# Patient Record
Sex: Female | Born: 1985 | Race: Black or African American | Hispanic: No
Health system: Western US, Academic
[De-identification: ages and names within clinical notes are randomized; demographics above are authoritative.]

## PROBLEM LIST (undated history)

## (undated) DIAGNOSIS — G43909 Migraine, unspecified, not intractable, without status migrainosus: Secondary | ICD-10-CM

## (undated) DIAGNOSIS — F32A Depression, unspecified: Secondary | ICD-10-CM

## (undated) DIAGNOSIS — F329 Major depressive disorder, single episode, unspecified: Secondary | ICD-10-CM

## (undated) DIAGNOSIS — E559 Vitamin D deficiency, unspecified: Secondary | ICD-10-CM

## (undated) HISTORY — PX: OTHER SURGICAL HISTORY: SHX169

## (undated) HISTORY — DX: Depression, unspecified: F32.A

## (undated) HISTORY — DX: Vitamin D deficiency, unspecified: E55.9

## (undated) HISTORY — DX: Major depressive disorder, single episode, unspecified: F32.9

## (undated) HISTORY — DX: Migraine, unspecified, not intractable, without status migrainosus: G43.909

---

## 2012-04-02 LAB — HM PAP SMEAR: HM Pap smear: NORMAL

## 2013-01-12 ENCOUNTER — Encounter: Payer: 59 | Attending: Obstetrics and Gynecology | Admitting: Dietician

## 2013-01-12 ENCOUNTER — Encounter: Payer: Self-pay | Admitting: Dietician

## 2013-01-12 VITALS — Ht 67.0 in | Wt 206.0 lb

## 2013-01-12 DIAGNOSIS — E663 Overweight: Secondary | ICD-10-CM | POA: Insufficient documentation

## 2013-01-12 DIAGNOSIS — Z713 Dietary counseling and surveillance: Secondary | ICD-10-CM | POA: Insufficient documentation

## 2013-01-12 NOTE — Progress Notes (Signed)
Medical Nutrition Therapy:  Appt start time: 0800 end time:  0900.  Assessment:  Primary concerns today: Overweight. Pt notes wt gain of about 20 pounds during last year or so of college, which she recently graduated. She admits during this time she did not exercise much at all and ate more sweets, drank more sweet drinks. She claims to have never been less than 170 pounds as an adult, and at the time she was very lean.  MEDICATIONS: see list.   DIETARY INTAKE: Usual eating pattern includes 2-3 meals and 2 snacks per day. Everyday foods include yogurt, oatmeal.  Avoided foods include none noted.    24-hr recall:  B ( AM): oatmeal (cooked in water) with 1 TBSP brown sugar and cinnamon. 28 oz. Green tea with 1 TBSP honey. Rarely eggs (never on working days).  Snk ( AM): 6 oz. Plain greek yogurt with 1 TBSP honey, vanilla extract, 1 cup frozen strawberries.   L ( PM): pork chop with sauteed zucchini, broccoli, brown rice, grapes.  Snk ( PM): apple or almonds, chocolate candies D ( PM): often skips dinner on days she works. On off days, she often gets [employer] burrito salad with brown rice, black beans, salsa, sour cream, cheese.  Snk ( PM): rarely, maybe a piece of fruit. May grab a milkshake or ice cream cone on way home from work.  Beverages: drinks water throughout day mostly, limiting sweet drinks to about 1-3 per week. Occasional chocolate milk. No EtOH.   Pt states she has a strong sweet tooth, and will go out of her way to get sweeter foods.   Usual physical activity: in past few weeks has begun exercising more. MWF weight training DVD from home (40-55 min). TuTh cardio DVDs (30-45 min).   Progress Towards Goal(s):  In progress.   Nutritional Diagnosis:  [place]-3.3 Overweight/obesity As related to Hx of sedentary behavior, regular consumption of swetened beverages, sweet dessert foods.  As evidenced by pt report, BMI>25.    Intervention:  Nutrition counseling provided regarding higher  kcal items pt regularly consumes, controlling portion sizes, limiting meal skipping, and ensuring adequate protein consumption. RD encouraged pt to gain consistency with exercise habits and dietary control. Pt has been tracking food intake on MyFitnessPal already, so RD set a kcal and protein goal for the pt at 1500 kcal average (stay within 1200-1800 each day) and 100 g Protein per day. RD highlighted the need to eat at least 3 meals per day, each with a protein component, and ideally with a high fiber food to improve satiety.  Handouts given during visit include:  Best Pro, CHO, and fats  High Fat, Sugar, Pro, and Fiber foods  Monitoring/Evaluation:  Dietary intake, exercise, portion control, and body weight in 2 month(s).

## 2013-03-09 ENCOUNTER — Ambulatory Visit: Payer: 59 | Admitting: Dietician

## 2013-04-30 ENCOUNTER — Encounter: Payer: Self-pay | Admitting: Family Medicine

## 2013-04-30 ENCOUNTER — Ambulatory Visit (INDEPENDENT_AMBULATORY_CARE_PROVIDER_SITE_OTHER): Payer: 59 | Admitting: Family Medicine

## 2013-04-30 VITALS — BP 122/74 | HR 59 | Temp 98.6°F | Resp 16 | Wt 209.0 lb

## 2013-04-30 DIAGNOSIS — G43109 Migraine with aura, not intractable, without status migrainosus: Secondary | ICD-10-CM | POA: Insufficient documentation

## 2013-04-30 DIAGNOSIS — Z202 Contact with and (suspected) exposure to infections with a predominantly sexual mode of transmission: Secondary | ICD-10-CM

## 2013-04-30 DIAGNOSIS — E669 Obesity, unspecified: Secondary | ICD-10-CM

## 2013-04-30 MED ORDER — RIZATRIPTAN BENZOATE 10 MG PO TABS: 10.0000 mg | ORAL_TABLET | ORAL | Status: DC | PRN

## 2013-04-30 MED ORDER — ONDANSETRON HCL 4 MG PO TABS: 4.0000 mg | ORAL_TABLET | Freq: Three times a day (TID) | ORAL | Status: DC | PRN

## 2013-04-30 NOTE — Progress Notes (Signed)
   Subjective:    Patient ID: Shannon Anderson, female    DOB: 15-Feb-1986, 28 y.o.   MRN: [id number]  HPI New to establish.  Previous MD- none.  GYN- [facility] ([facility])  Migraines- ongoing problem for pt, always on R side behind R eye.  Unaware of trigger.  + nausea, aura.  Sensitive to smells, light, and sound.  Taking Imitrex as needed- was not effective.  Typically taking Excedrin Migraine and trying to sleep it off.  Not tied to menstrual cycle.  Occuring 'a few times/yr'.  Obesity- ongoing problem.  Working out regularly.  Not following a particular diet  Possible exposure to STD- would like testing done  Review of Systems For ROS see HPI     Objective:   Physical Exam  Vitals reviewed. Constitutional: She is oriented to person, place, and time. She appears well-developed and well-nourished. No distress.  HENT:  Head: Normocephalic and atraumatic.  Eyes: Conjunctivae and EOM are normal. Pupils are equal, round, and reactive to light.  Neck: Normal range of motion. Neck supple. No thyromegaly present.  Cardiovascular: Normal rate, regular rhythm, normal heart sounds and intact distal pulses.   No murmur heard. Pulmonary/Chest: Effort normal and breath sounds normal. No respiratory distress.  Abdominal: Soft. She exhibits no distension. There is no tenderness.  Musculoskeletal: She exhibits no edema.  Lymphadenopathy:    She has no cervical adenopathy.  Neurological: She is alert and oriented to person, place, and time.  Skin: Skin is warm and dry.  Psychiatric: She has a normal mood and affect. Her behavior is normal.          Assessment & Plan:

## 2013-04-30 NOTE — Patient Instructions (Signed)
Follow up in 1 year or as needed We'll notify you of your lab results and make any changes if needed At the first sign of headache, take 2 Excedrin Migraine.  If headache continues, take Maxalt- repeat in 2 hrs if headache persists Zofran as needed for nausea Call with any questions or concerns Welcome!  We're glad to have you!!!

## 2013-04-30 NOTE — Progress Notes (Signed)
Pre-visit discussion using our clinic review tool, as applicable. No additional management support is needed unless otherwise documented below in the visit note.  

## 2013-05-01 LAB — CBC WITH DIFFERENTIAL/PLATELET
Basophils Absolute: 0 10*3/uL (ref 0.0–0.1)
Basophils Relative: 0.3 % (ref 0.0–3.0)
EOS ABS: 0.1 10*3/uL (ref 0.0–0.7)
Eosinophils Relative: 0.9 % (ref 0.0–5.0)
HCT: 37.3 % (ref 36.0–46.0)
HEMOGLOBIN: 12.1 g/dL (ref 12.0–15.0)
Lymphocytes Relative: 40.3 % (ref 12.0–46.0)
Lymphs Abs: 2.8 10*3/uL (ref 0.7–4.0)
MCHC: 32.3 g/dL (ref 30.0–36.0)
MCV: 86 fl (ref 78.0–100.0)
Monocytes Absolute: 0.3 10*3/uL (ref 0.1–1.0)
Monocytes Relative: 4.9 % (ref 3.0–12.0)
NEUTROS ABS: 3.7 10*3/uL (ref 1.4–7.7)
Neutrophils Relative %: 53.6 % (ref 43.0–77.0)
Platelets: 285 10*3/uL (ref 150.0–400.0)
RBC: 4.34 Mil/uL (ref 3.87–5.11)
RDW: 14.7 % — AB (ref 11.5–14.6)
WBC: 6.9 10*3/uL (ref 4.5–10.5)

## 2013-05-01 LAB — HEPATIC FUNCTION PANEL
ALK PHOS: 36 U/L — AB (ref 39–117)
ALT: 19 U/L (ref 0–35)
AST: 22 U/L (ref 0–37)
Albumin: 3.6 g/dL (ref 3.5–5.2)
BILIRUBIN TOTAL: 0.6 mg/dL (ref 0.3–1.2)
Bilirubin, Direct: 0 mg/dL (ref 0.0–0.3)
Total Protein: 6.8 g/dL (ref 6.0–8.3)

## 2013-05-01 LAB — HIV ANTIBODY (ROUTINE TESTING W REFLEX): HIV: NONREACTIVE

## 2013-05-01 LAB — LIPID PANEL
CHOLESTEROL: 148 mg/dL (ref 0–200)
HDL: 50.8 mg/dL (ref >39.00)
LDL Cholesterol: 90 mg/dL (ref 0–99)
Total CHOL/HDL Ratio: 3
Triglycerides: 37 mg/dL (ref 0.0–149.0)
VLDL: 7.4 mg/dL (ref 0.0–40.0)

## 2013-05-01 LAB — BASIC METABOLIC PANEL
BUN: 12 mg/dL (ref 6–23)
CALCIUM: 9 mg/dL (ref 8.4–10.5)
CHLORIDE: 106 meq/L (ref 96–112)
CO2: 26 mEq/L (ref 19–32)
CREATININE: 0.8 mg/dL (ref 0.4–1.2)
GFR: 113.75 mL/min (ref >60.00)
Glucose, Bld: 79 mg/dL (ref 70–99)
Potassium: 4 mEq/L (ref 3.5–5.1)
Sodium: 136 mEq/L (ref 135–145)

## 2013-05-01 LAB — GC/CHLAMYDIA PROBE AMP
CT Probe RNA: NEGATIVE
GC PROBE AMP APTIMA: NEGATIVE

## 2013-05-01 LAB — RPR

## 2013-05-01 LAB — TSH: TSH: 0.49 u[IU]/mL (ref 0.35–5.50)

## 2013-05-01 NOTE — Assessment & Plan Note (Signed)
New to provider, ongoing for pt.  Exercising regularly, not following particular diet.  Check labs to risk stratify.  Will follow.

## 2013-05-01 NOTE — Assessment & Plan Note (Signed)
New.  Check labs.  tx prn.

## 2013-05-01 NOTE — Assessment & Plan Note (Signed)
New to provider, ongoing for pt.  Start triptan prn.  zofran prn.  Reviewed supportive care and red flags that should prompt return.  Pt expressed understanding and is in agreement w/ plan.

## 2013-05-05 LAB — VITAMIN D 1,25 DIHYDROXY
Vitamin D 1, 25 (OH)2 Total: 73 pg/mL — ABNORMAL HIGH (ref 18–72)
Vitamin D2 1, 25 (OH)2: <8 pg/mL
Vitamin D3 1, 25 (OH)2: 73 pg/mL

## 2013-07-16 ENCOUNTER — Ambulatory Visit (INDEPENDENT_AMBULATORY_CARE_PROVIDER_SITE_OTHER): Payer: 59 | Admitting: Family Medicine

## 2013-07-16 ENCOUNTER — Encounter: Payer: Self-pay | Admitting: Family Medicine

## 2013-07-16 VITALS — BP 120/80 | HR 69 | Temp 98.4°F | Resp 16 | Wt 206.4 lb

## 2013-07-16 DIAGNOSIS — N760 Acute vaginitis: Secondary | ICD-10-CM | POA: Insufficient documentation

## 2013-07-16 DIAGNOSIS — Z202 Contact with and (suspected) exposure to infections with a predominantly sexual mode of transmission: Secondary | ICD-10-CM

## 2013-07-16 DIAGNOSIS — Z30432 Encounter for removal of intrauterine contraceptive device: Secondary | ICD-10-CM

## 2013-07-16 DIAGNOSIS — G4700 Insomnia, unspecified: Secondary | ICD-10-CM | POA: Insufficient documentation

## 2013-07-16 MED ORDER — FLUCONAZOLE 150 MG PO TABS: 150.0000 mg | ORAL_TABLET | Freq: Once | ORAL | Status: DC

## 2013-07-16 MED ORDER — TRAZODONE HCL 50 MG PO TABS: 25.0000 mg | ORAL_TABLET | Freq: Every evening | ORAL | Status: DC | PRN

## 2013-07-16 NOTE — Assessment & Plan Note (Signed)
IUD strings identified and copper IUD removed w/ firm, steady traction.  Pt tolerated procedure w/o difficulty.

## 2013-07-16 NOTE — Assessment & Plan Note (Signed)
New.  Start trazodone prn.

## 2013-07-16 NOTE — Patient Instructions (Signed)
Follow up as needed We'll notify you of your lab results and make any changes if needed Start the Diflucan for the yeast Call with any questions or concerns Hang in there!

## 2013-07-16 NOTE — Assessment & Plan Note (Signed)
Check labs to r/o infection.  Treat prn.

## 2013-07-16 NOTE — Progress Notes (Signed)
Pre visit review using our clinic review tool, if applicable. No additional management support is needed unless otherwise documented below in the visit note. 

## 2013-07-16 NOTE — Assessment & Plan Note (Signed)
New.  Start diflucan due to obvious fungal infxn

## 2013-07-16 NOTE — Progress Notes (Signed)
   Subjective:    Patient ID: Charolotte Capuchiniffany Ellenwood, female    DOB: December 29, 1985, 28 y.o.   MRN: [id number]  HPI Vaginal d/c- condom came off during intercourse 1 month ago.  Pt feels that she developed a yeast infxn last week.  Would like IUD removed and tested for STDs.  Insomnia- pt reports she is taking Melatonin.  Able to fall asleep but not able to stay asleep.   Review of Systems For ROS see HPI     Objective:   Physical Exam  Vitals reviewed. Constitutional: She appears well-developed and well-nourished. No distress.  Genitourinary: Cervix exhibits discharge (some mucous present). Cervix exhibits no motion tenderness and no friability. Vaginal discharge (consistent w/ fungal infection) found.  Strings of IUD visualized and grabbed w/ ring forceps- copper IUD removed w/ firm, steady traction          Assessment & Plan:

## 2013-07-17 LAB — RPR

## 2013-07-17 LAB — HIV ANTIBODY (ROUTINE TESTING W REFLEX): HIV: NONREACTIVE

## 2013-07-17 LAB — GC/CHLAMYDIA PROBE AMP, URINE
CHLAMYDIA, SWAB/URINE, PCR: NEGATIVE
GC PROBE AMP, URINE: NEGATIVE

## 2014-02-12 ENCOUNTER — Ambulatory Visit: Payer: 59 | Admitting: Family Medicine

## 2014-02-27 ENCOUNTER — Telehealth: Payer: 59 | Admitting: Nurse Practitioner

## 2014-02-27 DIAGNOSIS — B3731 Acute candidiasis of vulva and vagina: Secondary | ICD-10-CM

## 2014-02-27 DIAGNOSIS — B373 Candidiasis of vulva and vagina: Secondary | ICD-10-CM

## 2014-02-27 MED ORDER — FLUCONAZOLE 150 MG PO TABS: 150.0000 mg | ORAL_TABLET | Freq: Once | ORAL | Status: DC

## 2014-02-27 NOTE — Progress Notes (Signed)
We are sorry that you are not feeling well.  Here is how we plan to help! It does not sound like  To me that you have a UTI. We usually do not treat  Vaginal discharges through e visists , but since you are out of town I do not want you to sufer over the weekend. If you do start developing urgency and frequency with urine, please feel free to send another re visit request.  Based on what you shared with me it looks like you most likely have vaginal candidiasis. Vaginal candidiasis is a yeast infection of the vaginal area. Most cases of vaginal candidiasis are very simple to treat.  I have prescribed Diflucan 150mg  X 1 dose.  Your symptoms should gradually improve. Call us if the burning in your urine worsens, you develop worsening fever, back pain or pelvic pain or if your symptoms do not resolve after completing the antifungal.  You do not need to contact me back unless your symptoms change.  Your e-visit answers were reviewed by a board certified advanced clinical practitioner to complete your personal care plan.  Depending on the condition, your plan could have included both over the counter or prescription medications.  Please review your pharmacy choice.  If there is a problem, you may call our nursing hot line at [phone] and have the prescription routed to another pharmacy.  Your safety is important to us.  If you have drug allergies check your prescription carefully.    You can use MyChart to ask questions about today's visit, request a non-urgent call back, or ask for a work or school excuse.  You will get an e-mail in the next two days asking about your experience.  I hope that your e-visit has been valuable and will speed your recovery. Thank you for using e-visits.

## 2014-03-05 ENCOUNTER — Ambulatory Visit (INDEPENDENT_AMBULATORY_CARE_PROVIDER_SITE_OTHER): Payer: 59 | Admitting: Family Medicine

## 2014-03-05 ENCOUNTER — Encounter: Payer: Self-pay | Admitting: Family Medicine

## 2014-03-05 VITALS — BP 110/62 | HR 79 | Resp 16 | Wt 213.0 lb

## 2014-03-05 DIAGNOSIS — N760 Acute vaginitis: Secondary | ICD-10-CM

## 2014-03-05 NOTE — Patient Instructions (Signed)
Follow up as scheduled Due to allergic reaction to the Monistat- please check whether the active ingredient was Miconazole or Tioconazole (whichever it was, DON'T use it again!) If you have a future yeast infection, we will use Nystatin or Terconazole creams (or diflucan pill) Call with any questions or concerns Happy Holidays!

## 2014-03-05 NOTE — Progress Notes (Signed)
Pre visit review using our clinic review tool, if applicable. No additional management support is needed unless otherwise documented below in the visit note. 

## 2014-03-05 NOTE — Progress Notes (Signed)
   Subjective:    Patient ID: Charolotte Capuchiniffany Angeletti, female    DOB: 1985/06/27, 28 y.o.   MRN: [id number]  HPI Vaginal itching- pt noticed irritation a few days after period, thought it was yeast so used OTC Monistat 1 and then developed severe swelling, redness, itching.  Avoided meds for a few days and then used a few days of Metrogel.  Things have been improved for 2-3 days.  Pt just wants area evaluated to r/o infxn.   Review of Systems For ROS see HPI     Objective:   Physical Exam  Constitutional: She appears well-developed and well-nourished. No distress.  Genitourinary: No vaginal discharge found.  Skin: Skin is warm and dry.  No erythema or induration of labia.  Faint, resolving excoriations.  Vitals reviewed.         Assessment & Plan:

## 2014-03-05 NOTE — Assessment & Plan Note (Signed)
Pt w/o vaginal d/c and redness and itching have resolved.  Pt appears to have had allergic reaction to Monistat.  Pt instructed not to use product again.  Reviewed alternative txs w/ pt.  Will follow.

## 2014-04-01 ENCOUNTER — Encounter: Payer: Self-pay | Admitting: Family Medicine

## 2014-04-01 ENCOUNTER — Ambulatory Visit (INDEPENDENT_AMBULATORY_CARE_PROVIDER_SITE_OTHER): Payer: 59 | Admitting: Family Medicine

## 2014-04-01 VITALS — BP 112/80 | HR 69 | Temp 98.1°F | Resp 18 | Wt 212.0 lb

## 2014-04-01 DIAGNOSIS — G43109 Migraine with aura, not intractable, without status migrainosus: Secondary | ICD-10-CM

## 2014-04-01 DIAGNOSIS — N912 Amenorrhea, unspecified: Secondary | ICD-10-CM

## 2014-04-01 LAB — POCT URINE PREGNANCY: Preg Test, Ur: NEGATIVE

## 2014-04-01 NOTE — Progress Notes (Signed)
   Subjective:    Patient ID: Shannon Anderson, female    DOB: 22-Oct-1985, 28 y.o.   MRN: [id number]  HPI HAs- pt has hx of migraines.  Pt reports over the past few weeks (12/14) she has had increased HAs, not her usual triggers (MSG, artificial sweeteners).  + nausea.  Some sensitivity to smell in addition to usual photo and phonophobia.  Cycle is due in 1 week- doesn't typically have menstrual migraines.  Denies dietary changes or potluck eating.  HAs will resolve w/ Maxalt and Excedrin.  Also improves w/ sleep.  No sinus pain/pressure, no nasal congestion.  No medication changes.  Pt admits to increased stress- mom recently dx'd w/ breast cancer, high stress at work.   Review of Systems For ROS see HPI     Objective:   Physical Exam  Constitutional: She is oriented to person, place, and time. She appears well-developed and well-nourished. No distress.  HENT:  Head: Normocephalic and atraumatic.  TMs WNL No TTP over sinuses Minimal nasal congestion  Eyes: Conjunctivae and EOM are normal. Pupils are equal, round, and reactive to light.  Neck: Normal range of motion. Neck supple.  Cardiovascular: Normal rate, regular rhythm, normal heart sounds and intact distal pulses.   Pulmonary/Chest: Effort normal and breath sounds normal. No respiratory distress. She has no wheezes. She has no rales.  Lymphadenopathy:    She has no cervical adenopathy.  Neurological: She is alert and oriented to person, place, and time. She has normal reflexes. No cranial nerve deficit. Coordination normal.  Psychiatric: She has a normal mood and affect. Her behavior is normal. Judgment and thought content normal.  Vitals reviewed.         Assessment & Plan:

## 2014-04-01 NOTE — Progress Notes (Signed)
Pre visit review using our clinic review tool, if applicable. No additional management support is needed unless otherwise documented below in the visit note. 

## 2014-04-01 NOTE — Assessment & Plan Note (Signed)
Deteriorated.  Suspect this is combo of stress, regular migraines, lack of sleep.  PE and neuro exam in office WNL.  Discussed need to find stress outlet.  Continue to use OTC meds and triptans prn.  Reviewed supportive care and red flags that should prompt return.  Pt expressed understanding and is in agreement w/ plan.

## 2014-04-01 NOTE — Patient Instructions (Signed)
Please come back in 3-4 weeks if no improvement in headaches I think your increased headaches are a combination of stress, fatigue, possible superimposed viral illness, and holiday craziness Try and find a stress outlet- you deserve it! You are allowed to have a whole mess of emotions right now- all of them are appropriate and need attention This will be a journey and we are here if you need us! Call with any questions or concerns- particularly if the symptoms change or worsen Hang in there! Happy New Year!!!

## 2014-05-03 ENCOUNTER — Other Ambulatory Visit: Payer: Self-pay | Admitting: General Practice

## 2014-05-03 ENCOUNTER — Encounter: Payer: Self-pay | Admitting: Family Medicine

## 2014-05-03 ENCOUNTER — Ambulatory Visit (INDEPENDENT_AMBULATORY_CARE_PROVIDER_SITE_OTHER): Payer: 59 | Admitting: Family Medicine

## 2014-05-03 VITALS — BP 112/72 | HR 85 | Temp 98.3°F | Resp 16 | Ht 68.0 in | Wt 218.0 lb

## 2014-05-03 DIAGNOSIS — Z202 Contact with and (suspected) exposure to infections with a predominantly sexual mode of transmission: Secondary | ICD-10-CM

## 2014-05-03 DIAGNOSIS — E010 Iodine-deficiency related diffuse (endemic) goiter: Secondary | ICD-10-CM | POA: Insufficient documentation

## 2014-05-03 DIAGNOSIS — Z0000 Encounter for general adult medical examination without abnormal findings: Secondary | ICD-10-CM | POA: Insufficient documentation

## 2014-05-03 DIAGNOSIS — E049 Nontoxic goiter, unspecified: Secondary | ICD-10-CM

## 2014-05-03 DIAGNOSIS — Z23 Encounter for immunization: Secondary | ICD-10-CM

## 2014-05-03 LAB — T3, FREE: T3, Free: 3.6 pg/mL (ref 2.3–4.2)

## 2014-05-03 LAB — HEPATIC FUNCTION PANEL
ALT: 14 U/L (ref 0–35)
AST: 15 U/L (ref 0–37)
Albumin: 4 g/dL (ref 3.5–5.2)
Alkaline Phosphatase: 51 U/L (ref 39–117)
Bilirubin, Direct: 0.1 mg/dL (ref 0.0–0.3)
Total Bilirubin: 0.2 mg/dL (ref 0.2–1.2)
Total Protein: 6.9 g/dL (ref 6.0–8.3)

## 2014-05-03 LAB — HIV ANTIBODY (ROUTINE TESTING W REFLEX): HIV 1&2 Ab, 4th Generation: NONREACTIVE

## 2014-05-03 LAB — BASIC METABOLIC PANEL
BUN: 11 mg/dL (ref 6–23)
CHLORIDE: 104 meq/L (ref 96–112)
CO2: 26 mEq/L (ref 19–32)
Calcium: 9 mg/dL (ref 8.4–10.5)
Creatinine, Ser: 0.88 mg/dL (ref 0.40–1.20)
GFR: 98.24 mL/min (ref >60.00)
GLUCOSE: 84 mg/dL (ref 70–99)
Potassium: 3.8 mEq/L (ref 3.5–5.1)
Sodium: 135 mEq/L (ref 135–145)

## 2014-05-03 LAB — CBC WITH DIFFERENTIAL/PLATELET
Basophils Absolute: 0 10*3/uL (ref 0.0–0.1)
Basophils Relative: 0.5 % (ref 0.0–3.0)
Eosinophils Absolute: 0.1 10*3/uL (ref 0.0–0.7)
Eosinophils Relative: 1.1 % (ref 0.0–5.0)
HCT: 34.9 % — ABNORMAL LOW (ref 36.0–46.0)
Hemoglobin: 11.6 g/dL — ABNORMAL LOW (ref 12.0–15.0)
Lymphocytes Relative: 35.7 % (ref 12.0–46.0)
Lymphs Abs: 2.4 10*3/uL (ref 0.7–4.0)
MCHC: 33.2 g/dL (ref 30.0–36.0)
MCV: 82.3 fl (ref 78.0–100.0)
Monocytes Absolute: 0.4 10*3/uL (ref 0.1–1.0)
Monocytes Relative: 6.2 % (ref 3.0–12.0)
Neutro Abs: 3.9 10*3/uL (ref 1.4–7.7)
Neutrophils Relative %: 56.5 % (ref 43.0–77.0)
Platelets: 273 10*3/uL (ref 150.0–400.0)
RBC: 4.24 Mil/uL (ref 3.87–5.11)
RDW: 13.5 % (ref 11.5–15.5)
WBC: 6.8 10*3/uL (ref 4.0–10.5)

## 2014-05-03 LAB — LIPID PANEL
Cholesterol: 159 mg/dL (ref 0–200)
HDL: 46 mg/dL (ref >39.00)
LDL Cholesterol: 100 mg/dL — ABNORMAL HIGH (ref 0–99)
NonHDL: 113
Total CHOL/HDL Ratio: 3
Triglycerides: 66 mg/dL (ref 0.0–149.0)
VLDL: 13.2 mg/dL (ref 0.0–40.0)

## 2014-05-03 LAB — T4, FREE: FREE T4: 0.85 ng/dL (ref 0.60–1.60)

## 2014-05-03 LAB — TSH: TSH: 0.78 u[IU]/mL (ref 0.35–4.50)

## 2014-05-03 LAB — RPR

## 2014-05-03 LAB — VITAMIN D 25 HYDROXY (VIT D DEFICIENCY, FRACTURES): VITD: 15.4 ng/mL — ABNORMAL LOW (ref 30.00–100.00)

## 2014-05-03 MED ORDER — VITAMIN D (ERGOCALCIFEROL) 1.25 MG (50000 UNIT) PO CAPS: 50000.0000 [IU] | ORAL_CAPSULE | ORAL | Status: DC

## 2014-05-03 MED ORDER — TRAZODONE HCL 50 MG PO TABS: ORAL_TABLET | ORAL | Status: DC

## 2014-05-03 NOTE — Progress Notes (Signed)
   Subjective:    Patient ID: Shannon Anderson, female    DOB: 18-Feb-1986, 29 y.o.   MRN: [id number]  HPI CPE- GYN [facility].  Pt requesting STD testing.  Pt was told that thyroid was large at the dentist.   Review of Systems Patient reports no vision/hearing changes, adenopathy, fever, weight change,  persistant/recurrent hoarseness, swallowing issues, chest pain, palpitations, edema, persistant/recurrent cough, hemoptysis, dyspnea (rest/exertional/paroxysmal nocturnal), gastrointestinal bleeding (melena, rectal bleeding), abdominal pain, significant heartburn, bowel changes, GU symptoms (dysuria, hematuria, incontinence), Gyn symptoms (abnormal  bleeding, pain), syncope, focal weakness, memory loss, numbness & tingling, abnormal bruising or bleeding, anxiety, or depression.   + skin, hair, brittle nails  Reviewed meds, allergies, problem list, and PMH in chart      Objective:   Physical Exam General Appearance:    Alert, cooperative, no distress, appears stated age  Head:    Normocephalic, without obvious abnormality, atraumatic  Eyes:    PERRL, conjunctiva/corneas clear, EOM's intact, fundi    benign, both eyes  Ears:    Normal TM's and external ear canals, both ears  Nose:   Nares normal, septum midline, mucosa normal, no drainage    or sinus tenderness  Throat:   Lips, mucosa, and tongue normal; teeth and gums normal  Neck:   Supple, symmetrical, trachea midline, no adenopathy;    Thyroid: diffuse thyroid enlargement w/o nodularity  Back:     Symmetric, no curvature, ROM normal, no CVA tenderness  Lungs:     Clear to auscultation bilaterally, respirations unlabored  Chest Wall:    No tenderness or deformity   Heart:    Regular rate and rhythm, S1 and S2 normal, no murmur, rub   or gallop  Breast Exam:    Deferred to GYN  Abdomen:     Soft, non-tender, bowel sounds active all four quadrants,    no masses, no organomegaly  Genitalia:    Deferred to GYN  Rectal:    Extremities:    Extremities normal, atraumatic, no cyanosis or edema  Pulses:   2+ and symmetric all extremities  Skin:   Skin color, texture, turgor normal, no rashes or lesions  Lymph nodes:   Cervical, supraclavicular, and axillary nodes normal  Neurologic:   CNII-XII intact, normal strength, sensation and reflexes    throughout          Assessment & Plan:

## 2014-05-03 NOTE — Assessment & Plan Note (Signed)
New.  Check T3/T4 in addition to TSH.  Also get US to assess for nodularity.  Will decide next steps pending results of imaging and labs.  Pt expressed understanding and is in agreement w/ plan.

## 2014-05-03 NOTE — Patient Instructions (Signed)
Follow up in 1 year or as needed We'll notify you of your lab results and make any changes if needed We'll call you with your thyroid US appt Call and schedule w/ Dr Langston MaskerMorris- it would be a good idea to discuss baseline mammo at this time Continue the 1/2 tab of trazodone for daily naps- if you need a full night's sleep, take 2 Call with any questions or concerns Happy New Year!

## 2014-05-03 NOTE — Assessment & Plan Note (Signed)
Check labs at pt's request.  Tx prn.

## 2014-05-03 NOTE — Progress Notes (Signed)
Pre visit review using our clinic review tool, if applicable. No additional management support is needed unless otherwise documented below in the visit note. 

## 2014-05-03 NOTE — Assessment & Plan Note (Signed)
Pt's PE WNL w/ exception of thyromegaly.  UTD on GYN.  Check labs- including STD testing at pt's request.  Anticipatory guidance provided.

## 2014-05-04 LAB — GC/CHLAMYDIA PROBE AMP, URINE
Chlamydia, Swab/Urine, PCR: NEGATIVE
GC Probe Amp, Urine: NEGATIVE

## 2014-05-05 ENCOUNTER — Ambulatory Visit (HOSPITAL_BASED_OUTPATIENT_CLINIC_OR_DEPARTMENT_OTHER): Payer: 59

## 2014-05-07 ENCOUNTER — Ambulatory Visit (HOSPITAL_BASED_OUTPATIENT_CLINIC_OR_DEPARTMENT_OTHER): Payer: 59

## 2014-06-07 ENCOUNTER — Ambulatory Visit (HOSPITAL_BASED_OUTPATIENT_CLINIC_OR_DEPARTMENT_OTHER)
Admission: RE | Admit: 2014-06-07 | Discharge: 2014-06-07 | Disposition: A | Discharge status: Home / Self Care | Payer: 59 | Source: Ambulatory Visit | Attending: Family Medicine | Admitting: Family Medicine

## 2014-06-07 DIAGNOSIS — E049 Nontoxic goiter, unspecified: Secondary | ICD-10-CM | POA: Insufficient documentation

## 2014-06-07 DIAGNOSIS — E010 Iodine-deficiency related diffuse (endemic) goiter: Secondary | ICD-10-CM

## 2014-06-22 ENCOUNTER — Other Ambulatory Visit: Payer: Self-pay | Admitting: Family Medicine

## 2014-06-22 NOTE — Telephone Encounter (Signed)
Med filled.  

## 2014-07-29 ENCOUNTER — Encounter: Payer: Self-pay | Admitting: Family Medicine

## 2014-09-29 ENCOUNTER — Telehealth: Payer: Self-pay | Admitting: Family Medicine

## 2014-09-29 NOTE — Telephone Encounter (Signed)
Caller name: Jerzee Relation to pt: Call back number: [phone] Pharmacy[facility]  Reason for call:   Patient states that she has been taking Trazadone 100mg  and this is still not helping her sleep and wants to know what she should do?

## 2014-09-29 NOTE — Telephone Encounter (Signed)
Pt seen 05/12/14 for CPE, trazodone was filled then #[phone] with 3 refills, no note for pt to follow up. Please advise?

## 2014-09-29 NOTE — Telephone Encounter (Signed)
If still not sleeping, needs OV to discuss

## 2014-09-29 NOTE — Telephone Encounter (Signed)
Called pt and informed that she would need an appointment to discuss insomnia. Pt stated an understanding. advised that she would call back when she has her schedule.

## 2014-10-01 ENCOUNTER — Encounter: Payer: Self-pay | Admitting: Family Medicine

## 2014-10-01 ENCOUNTER — Ambulatory Visit (INDEPENDENT_AMBULATORY_CARE_PROVIDER_SITE_OTHER): Payer: BC Managed Care – PPO | Admitting: Family Medicine

## 2014-10-01 ENCOUNTER — Ambulatory Visit: Payer: 59 | Admitting: Family Medicine

## 2014-10-01 VITALS — BP 110/78 | HR 68 | Temp 98.5°F | Resp 16 | Wt 218.0 lb

## 2014-10-01 DIAGNOSIS — F32A Depression, unspecified: Secondary | ICD-10-CM | POA: Insufficient documentation

## 2014-10-01 DIAGNOSIS — G4700 Insomnia, unspecified: Secondary | ICD-10-CM | POA: Diagnosis not present

## 2014-10-01 DIAGNOSIS — F329 Major depressive disorder, single episode, unspecified: Secondary | ICD-10-CM

## 2014-10-01 DIAGNOSIS — E559 Vitamin D deficiency, unspecified: Secondary | ICD-10-CM | POA: Insufficient documentation

## 2014-10-01 MED ORDER — FLUOXETINE HCL 10 MG PO TABS: 10.0000 mg | ORAL_TABLET | Freq: Every day | ORAL | Status: DC

## 2014-10-01 NOTE — Progress Notes (Signed)
   Subjective:    Patient ID: Shannon Anderson, female    DOB: 12-01-1985, 29 y.o.   MRN: [id number]  HPI Insomnia- pt reports trazodone is not working even after increasing dose to 100mg .  Pt is not having difficulty falling asleep but is unable to stay asleep.  Pt is having racing thoughts, 'i'm very moody', 'all i want to do is sleep'.  'i'm just in a funk and I can't get out of it'.  Pt admits that bad days are outnumbering the good days.  Feeling sad, mopey, irritable.     Review of Systems For ROS see HPI     Objective:   Physical Exam  Constitutional: She is oriented to person, place, and time. She appears well-developed and well-nourished. No distress.  overweight  HENT:  Head: Normocephalic and atraumatic.  Eyes: Conjunctivae and EOM are normal. Pupils are equal, round, and reactive to light.  Pulmonary/Chest: Effort normal. No respiratory distress.  Musculoskeletal: She exhibits no edema.  Neurological: She is alert and oriented to person, place, and time.  Skin: Skin is warm and dry.  Psychiatric: She has a normal mood and affect. Her behavior is normal. Thought content normal.  Vitals reviewed.         Assessment & Plan:

## 2014-10-01 NOTE — Progress Notes (Signed)
Pre visit review using our clinic review tool, if applicable. No additional management support is needed unless otherwise documented below in the visit note. 

## 2014-10-01 NOTE — Assessment & Plan Note (Signed)
New.  Pt has been struggling since her mom's cancer diagnosis this winter.  sxs have been worsening recently to the point that the bad days outnumber the good.  Start low dose SSRI.  Encouraged stress outlet.  Will follow closely.

## 2014-10-01 NOTE — Assessment & Plan Note (Signed)
Chronic problem.  Suspect this is a byproduct of her depression.  Continue trazodone for now as we start SSRI.  May need to switch from Trazodone to alternative sleep aid if no improvement.  Reviewed sleep hygiene.  Will follow.

## 2014-10-01 NOTE — Patient Instructions (Signed)
Follow up in 4-6 weeks to recheck mood and sleep Start the Prozac daily Continue the trazodone as needed Try and find a stress outlet- you deserve it! Call with any questions or concerns Hang in there!  This will get better!

## 2014-11-03 ENCOUNTER — Encounter: Payer: Self-pay | Admitting: Family Medicine

## 2014-11-03 ENCOUNTER — Ambulatory Visit (INDEPENDENT_AMBULATORY_CARE_PROVIDER_SITE_OTHER): Payer: BC Managed Care – PPO | Admitting: Family Medicine

## 2014-11-03 VITALS — BP 108/74 | HR 72 | Temp 98.0°F | Resp 16 | Ht 68.0 in | Wt 216.5 lb

## 2014-11-03 DIAGNOSIS — F32A Depression, unspecified: Secondary | ICD-10-CM

## 2014-11-03 DIAGNOSIS — Z202 Contact with and (suspected) exposure to infections with a predominantly sexual mode of transmission: Secondary | ICD-10-CM | POA: Diagnosis not present

## 2014-11-03 DIAGNOSIS — G4700 Insomnia, unspecified: Secondary | ICD-10-CM | POA: Diagnosis not present

## 2014-11-03 DIAGNOSIS — F329 Major depressive disorder, single episode, unspecified: Secondary | ICD-10-CM

## 2014-11-03 LAB — HIV ANTIBODY (ROUTINE TESTING W REFLEX): HIV 1&2 Ab, 4th Generation: NONREACTIVE

## 2014-11-03 LAB — RPR

## 2014-11-03 MED ORDER — FLUOXETINE HCL 20 MG PO TABS: 20.0000 mg | ORAL_TABLET | Freq: Every day | ORAL | Status: DC

## 2014-11-03 NOTE — Progress Notes (Signed)
Pre visit review using our clinic review tool, if applicable. No additional management support is needed unless otherwise documented below in the visit note. 

## 2014-11-03 NOTE — Progress Notes (Signed)
   Subjective:    Patient ID: Shannon Anderson, female    DOB: [birth year], 29 y.o.   MRN: [id number]  HPI Depression- 'my mood is still kinda eh'.  Did have increased motivation to study for test.  Less tearful than previous but still has some down days.  Mom's health is improving.  Father stopped talking to pt- 'he said you're pretty much dead to me' and cut off communication.  Best friend moved home to [facility] 'so my support system is really limited'.  Boyfriend of over a year admitted to cheating on pt in December.  Insomnia- pt reports sleep is improving since starting Prozac.  Still using Trazodone.  Possible STD exposure- pt is asking for testing due to fact that boyfriend cheated   Review of Systems For ROS see HPI     Objective:   Physical Exam  Constitutional: She is oriented to person, place, and time. She appears well-developed and well-nourished. No distress.  HENT:  Head: Normocephalic and atraumatic.  Eyes: Conjunctivae and EOM are normal. Pupils are equal, round, and reactive to light.  Neurological: She is alert and oriented to person, place, and time.  Skin: Skin is warm and dry.  Psychiatric: She has a normal mood and affect. Her behavior is normal. Thought content normal.  Vitals reviewed.         Assessment & Plan:

## 2014-11-03 NOTE — Patient Instructions (Signed)
Follow up in 6-8 weeks to recheck mood Increase Prozac to  daily- 2 of what you have at home, 1 of the new prescription (downstairs) Continue the Trazodone as needed Continue to work on a stress outlet- you deserve it!! Call with any questions or concerns Hang in there!!!

## 2014-11-03 NOTE — Assessment & Plan Note (Signed)
New.  Pt's boyfriend admitted to cheating.  Will check labs to r/o infxn.

## 2014-11-03 NOTE — Assessment & Plan Note (Signed)
Pt has had a difficult set of circumstances recently- falling out w/ father, boyfriend cheated, best friend moved.  She is coping remarkably well but still struggling w/ decreased motivation and now increased sleep.  Based on her ongoing depression, will increase Prozac to  daily and continue to monitor for improvement.

## 2014-11-03 NOTE — Assessment & Plan Note (Signed)
Improved.  Pt is now having increased sleep rather than insomnia.  Continue trazodone as needed but increase Prozac daily.  Will follow.

## 2014-11-04 LAB — GC/CHLAMYDIA PROBE AMP, URINE
Chlamydia, Swab/Urine, PCR: NEGATIVE
GC Probe Amp, Urine: NEGATIVE

## 2014-12-14 ENCOUNTER — Ambulatory Visit (INDEPENDENT_AMBULATORY_CARE_PROVIDER_SITE_OTHER): Payer: BC Managed Care – PPO | Admitting: Family Medicine

## 2014-12-14 ENCOUNTER — Encounter: Payer: Self-pay | Admitting: Family Medicine

## 2014-12-14 VITALS — BP 110/80 | HR 85 | Temp 98.4°F | Resp 16 | Ht 68.0 in | Wt 223.5 lb

## 2014-12-14 DIAGNOSIS — F32A Depression, unspecified: Secondary | ICD-10-CM

## 2014-12-14 DIAGNOSIS — R631 Polydipsia: Secondary | ICD-10-CM

## 2014-12-14 DIAGNOSIS — E669 Obesity, unspecified: Secondary | ICD-10-CM

## 2014-12-14 DIAGNOSIS — F329 Major depressive disorder, single episode, unspecified: Secondary | ICD-10-CM

## 2014-12-14 LAB — GLUCOSE, POCT (MANUAL RESULT ENTRY): POC Glucose: 90 mg/dl (ref 70–99)

## 2014-12-14 MED ORDER — BUPROPION HCL ER (XL) 150 MG PO TB24: 150.0000 mg | ORAL_TABLET | Freq: Every day | ORAL | Status: DC

## 2014-12-14 MED ORDER — TRAZODONE HCL 50 MG PO TABS: ORAL_TABLET | ORAL | Status: DC

## 2014-12-14 NOTE — Patient Instructions (Signed)
Follow up in 4-6 weeks to recheck mood STOP the Prozac START the Wellbutrin once daily- if you develop side effects, please let me know immediately and do not wait the 4-6 weeks Your sugar is fine!  So the increased thirst is most likely due to the Prozac Resume healthy diet and regular exercise- you can do this! Call with any questions or concerns Hang in there!  We'll get this right!!

## 2014-12-14 NOTE — Assessment & Plan Note (Signed)
New.  Suspect this is side effect of Prozac as her sugar, not fasting, is only 90.  No evidence of diabetes but did stress the need for healthy diet and regular exercise to avoid moving in that direction.  Will follow.

## 2014-12-14 NOTE — Progress Notes (Signed)
Pre visit review using our clinic review tool, if applicable. No additional management support is needed unless otherwise documented below in the visit note. 

## 2014-12-14 NOTE — Assessment & Plan Note (Signed)
Ongoing issue for pt.  Again discussed the need for healthy diet and regular exercise.  Will continue to follow.

## 2014-12-14 NOTE — Progress Notes (Signed)
   Subjective:    Patient ID: Shannon Anderson, female    DOB: [birth year], 29 y.o.   MRN: [id number]  HPI Depression- ongoing issue for Shannon Anderson.  At last visit, Prozac was increased to  daily.  Shannon Anderson reports she continues to have 'good days and bad'.  Feels that bad days may be getting easier to handle.  Mom's health is stable.  Still no communication w/ dad.  Less tearful than previous.  Some mild improvement in motivation to get out and socialize but still very little.  Shannon Anderson reports increased nightmares.  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  Not exercising- 'i started and then...'  Not following particular diet  Polydipsia- increased thirst recently.  Shannon Anderson is not sure if this is related to Prozac or not.  Shannon Anderson also reports sticky urine.  Shannon Anderson interested in finger stick sugar today.   Review of Systems For ROS see HPI     Objective:   Physical Exam  Constitutional: She is oriented to person, place, and time. She appears well-developed and well-nourished. No distress.  obese  HENT:  Head: Normocephalic and atraumatic.  Eyes: Conjunctivae and EOM are normal. Pupils are equal, round, and reactive to light.  Neck: Normal range of motion. Neck supple. No thyromegaly present.  Cardiovascular: Normal rate, regular rhythm, normal heart sounds and intact distal pulses.   No murmur heard. Pulmonary/Chest: Effort normal and breath sounds normal. No respiratory distress.  Abdominal: Soft. She exhibits no distension. There is no tenderness.  Musculoskeletal: She exhibits no edema.  Lymphadenopathy:    She has no cervical adenopathy.  Neurological: She is alert and oriented to person, place, and time.  Skin: Skin is warm and dry.  Psychiatric: She has a normal mood and affect. Her behavior is normal.  Vitals reviewed.         Assessment & Plan:

## 2014-12-14 NOTE — Assessment & Plan Note (Signed)
Chronic problem.  Pt is having dry mouth and nightmares on the prozac and still has very little energy/motivation.  Based on this, will switch to Wellbutrin and monitor for improvement.  Pt expressed understanding and is in agreement w/ plan.

## 2014-12-24 ENCOUNTER — Telehealth: Payer: Self-pay | Admitting: Family Medicine

## 2014-12-24 NOTE — Telephone Encounter (Signed)
Spoke with pt and she advised that she is "chill" the tremors are primarily in her hands and come and go. Spoke with PCP who advised pt to make sure she is eating, also will log her symptoms this weekend and will be seen on 12/27/14.

## 2014-12-24 NOTE — Telephone Encounter (Signed)
Does she feel shaky?  Is the tremor always there or does it come and go?  Has the mood improved?  I need more information to give better advice

## 2014-12-24 NOTE — Telephone Encounter (Signed)
Relation to pt: self  Call back number: [phone]   Reason for call:  Patient states she stopped taking FLUoxetine (PROZAC) 10 MG tablet  And started taking the buPROPion (WELLBUTRIN XL) 150 MG 24 hr tablet and she is noticed hand tremors. Patient scheduled appointment for 9/26/201. Does patient need to keep appointment or can she be advised over the phone.

## 2014-12-27 ENCOUNTER — Ambulatory Visit (INDEPENDENT_AMBULATORY_CARE_PROVIDER_SITE_OTHER): Payer: BC Managed Care – PPO | Admitting: Family Medicine

## 2014-12-27 ENCOUNTER — Encounter: Payer: Self-pay | Admitting: Family Medicine

## 2014-12-27 VITALS — BP 110/72 | HR 71 | Temp 98.0°F | Resp 16 | Wt 214.4 lb

## 2014-12-27 DIAGNOSIS — F329 Major depressive disorder, single episode, unspecified: Secondary | ICD-10-CM

## 2014-12-27 DIAGNOSIS — F32A Depression, unspecified: Secondary | ICD-10-CM

## 2014-12-27 MED ORDER — VENLAFAXINE HCL ER 37.5 MG PO CP24: 75.0000 mg | ORAL_CAPSULE | Freq: Every day | ORAL | Status: DC

## 2014-12-27 NOTE — Patient Instructions (Signed)
Follow up by phone or MyChart in 3-4 weeks STOP the Wellbutrin START the Effexor once daily x2 weeks and then increase to 2 tabs daily ( ) Continue to work on healthy diet and regular exercise Call with any questions or concerns If you want to join [place] at the new [place] office, any scheduled appointments will automatically transfer and we will see you at [address] [place] [address] [place], [place], [place] [address]  Hang in there!!!

## 2014-12-27 NOTE — Progress Notes (Signed)
Pre visit review using our clinic review tool, if applicable. No additional management support is needed unless otherwise documented below in the visit note. 

## 2014-12-27 NOTE — Assessment & Plan Note (Signed)
Pt's mood and motivation are improved but pt has lost 9 lbs in 2 weeks and is unable to sit still in office.  She is very agitated today and does note increased anxiety.  Suspect all of this is due to pt's Wellbutrin.  Based on this, will stop medication and start Effexor (prozac didn't improve motivation, Wellbutrin is too stimulating).  Reviewed supportive care and red flags that should prompt return.  Pt expressed understanding and is in agreement w/ plan.

## 2014-12-27 NOTE — Progress Notes (Signed)
   Subjective:    Patient ID: Shannon Anderson, female    DOB: February 05, 1986, 29 y.o.   MRN: [id number]  HPI Depression- pt was switched from Prozac at last visit to Wellbutrin.  Pt reports tremors in hands for last 2 weeks.  Denies racing thoughts, psychomotor agitation.  But Very vivid dreams.  Having 'knots in my stomach'.  Occuring intermittently, resolves spontaneously.  Pt has lost 9 lbs in 2 weeks   Review of Systems For ROS see HPI     Objective:   Physical Exam  Constitutional: She is oriented to person, place, and time. She appears well-developed and well-nourished. No distress.  HENT:  Head: Normocephalic and atraumatic.  Cardiovascular: Normal rate, regular rhythm, normal heart sounds and intact distal pulses.   Pulmonary/Chest: Effort normal and breath sounds normal. No respiratory distress. She has no wheezes. She has no rales.  Neurological: She is alert and oriented to person, place, and time.  Skin: Skin is warm and dry.  Psychiatric: She has a normal mood and affect. Thought content normal.  + psychomotor agitation.  Pt unable to sit still in office  Vitals reviewed.         Assessment & Plan:

## 2015-01-07 ENCOUNTER — Emergency Department (HOSPITAL_COMMUNITY)
Admission: EM | Admit: 2015-01-07 | Discharge: 2015-01-07 | Disposition: A | Discharge status: Home / Self Care | Payer: BC Managed Care – PPO | Attending: Physician Assistant | Admitting: Physician Assistant

## 2015-01-07 ENCOUNTER — Emergency Department (HOSPITAL_COMMUNITY): Payer: BC Managed Care – PPO

## 2015-01-07 ENCOUNTER — Encounter (HOSPITAL_COMMUNITY): Payer: Self-pay | Admitting: Emergency Medicine

## 2015-01-07 DIAGNOSIS — S199XXA Unspecified injury of neck, initial encounter: Secondary | ICD-10-CM | POA: Insufficient documentation

## 2015-01-07 DIAGNOSIS — S299XXA Unspecified injury of thorax, initial encounter: Secondary | ICD-10-CM | POA: Insufficient documentation

## 2015-01-07 DIAGNOSIS — Z79899 Other long term (current) drug therapy: Secondary | ICD-10-CM | POA: Insufficient documentation

## 2015-01-07 DIAGNOSIS — Y9389 Activity, other specified: Secondary | ICD-10-CM | POA: Diagnosis not present

## 2015-01-07 DIAGNOSIS — G43909 Migraine, unspecified, not intractable, without status migrainosus: Secondary | ICD-10-CM | POA: Insufficient documentation

## 2015-01-07 DIAGNOSIS — M62838 Other muscle spasm: Secondary | ICD-10-CM | POA: Diagnosis not present

## 2015-01-07 DIAGNOSIS — Y998 Other external cause status: Secondary | ICD-10-CM | POA: Insufficient documentation

## 2015-01-07 DIAGNOSIS — Y92410 Unspecified street and highway as the place of occurrence of the external cause: Secondary | ICD-10-CM | POA: Diagnosis not present

## 2015-01-07 DIAGNOSIS — F329 Major depressive disorder, single episode, unspecified: Secondary | ICD-10-CM | POA: Diagnosis not present

## 2015-01-07 MED ORDER — CYCLOBENZAPRINE HCL 10 MG PO TABS
10.0000 mg | ORAL_TABLET | Freq: Once | ORAL | Status: AC
  Administered 2015-01-07: 10 mg via ORAL
  Filled 2015-01-07: qty 1

## 2015-01-07 MED ORDER — IBUPROFEN 800 MG PO TABS
800.0000 mg | ORAL_TABLET | Freq: Once | ORAL | Status: AC
  Administered 2015-01-07: 800 mg via ORAL
  Filled 2015-01-07: qty 1

## 2015-01-07 MED ORDER — IBUPROFEN 800 MG PO TABS: 800.0000 mg | ORAL_TABLET | Freq: Three times a day (TID) | ORAL | Status: DC

## 2015-01-07 MED ORDER — CYCLOBENZAPRINE HCL 10 MG PO TABS: 10.0000 mg | ORAL_TABLET | Freq: Two times a day (BID) | ORAL | Status: DC | PRN

## 2015-01-07 NOTE — ED Notes (Signed)
Patient transported to X-ray 

## 2015-01-07 NOTE — ED Notes (Signed)
Discharge instructions and rx x1 reviewed with patient. Patient verbalized understanding 
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
## 2015-01-07 NOTE — ED Notes (Signed)
Bed: WA02 Expected date:  Expected time:  Means of arrival:  Comments: ems 

## 2015-01-13 ENCOUNTER — Ambulatory Visit: Payer: BC Managed Care – PPO | Admitting: Family Medicine

## 2015-01-26 ENCOUNTER — Telehealth: Payer: Self-pay | Admitting: Family Medicine

## 2015-01-26 NOTE — Telephone Encounter (Signed)
Pt would like return phone call regarding side effects from her medications. Best # [phone].

## 2015-01-26 NOTE — Telephone Encounter (Signed)
Called patient to see which medications she is having a reaction to. Left message for callback.

## 2015-01-26 NOTE — Telephone Encounter (Signed)
Please can you call and find out what medication questions she has?

## 2015-01-27 MED ORDER — CITALOPRAM HYDROBROMIDE 20 MG PO TABS: 20.0000 mg | ORAL_TABLET | Freq: Every day | ORAL | Status: DC

## 2015-01-27 NOTE — Telephone Encounter (Signed)
Medication filled to pharmacy as requested. Chart updated and pt made aware.

## 2015-01-27 NOTE — Telephone Encounter (Signed)
Switch to Celexa 20mg  daily

## 2015-01-27 NOTE — Telephone Encounter (Signed)
Per patient she is having reaction to Effexor. States she is having periodic tremors,night sweats and bizzare dreams. Would like to know if she can be prescribed a different medication for her depression.

## 2015-03-01 ENCOUNTER — Telehealth: Payer: Self-pay | Admitting: Family Medicine

## 2015-03-01 NOTE — Telephone Encounter (Signed)
Caller name: Self   Can be reached: [phone]    Reason for call: Patient wants to know if she needs to adjust her   traZODone (DESYREL) 50 MG tablet [[id number]][[id number]]      States that she can only sleep approximately 4 hours per night

## 2015-03-02 NOTE — Telephone Encounter (Signed)
Yes- pt needs appt to discuss since this could be depression/anxiety related

## 2015-03-02 NOTE — Telephone Encounter (Signed)
Pt was last seen in September for Depression. Would you like for her to come in for a follow up?

## 2015-03-02 NOTE — Telephone Encounter (Signed)
Called pt and advised that she would need to be seen. Pt stated that she could not come in this week and will call us when she is able to schedule for next week.

## 2015-04-07 ENCOUNTER — Ambulatory Visit: Payer: Self-pay | Admitting: Family Medicine

## 2015-04-07 ENCOUNTER — Telehealth: Payer: Self-pay | Admitting: Family Medicine

## 2015-04-07 ENCOUNTER — Encounter: Payer: Self-pay | Admitting: Family Medicine

## 2015-04-07 ENCOUNTER — Ambulatory Visit (INDEPENDENT_AMBULATORY_CARE_PROVIDER_SITE_OTHER): Payer: BC Managed Care – PPO | Admitting: Family Medicine

## 2015-04-07 ENCOUNTER — Other Ambulatory Visit (HOSPITAL_COMMUNITY)
Admission: RE | Admit: 2015-04-07 | Discharge: 2015-04-07 | Disposition: A | Discharge status: Home / Self Care | Payer: BC Managed Care – PPO | Source: Ambulatory Visit | Attending: Family Medicine | Admitting: Family Medicine

## 2015-04-07 VITALS — BP 117/57 | HR 68 | Temp 98.9°F | Resp 16 | Ht 67.0 in | Wt 225.0 lb

## 2015-04-07 DIAGNOSIS — N760 Acute vaginitis: Secondary | ICD-10-CM | POA: Insufficient documentation

## 2015-04-07 DIAGNOSIS — Z113 Encounter for screening for infections with a predominantly sexual mode of transmission: Secondary | ICD-10-CM | POA: Diagnosis present

## 2015-04-07 NOTE — Progress Notes (Signed)
Pre visit review using our clinic review tool, if applicable. No additional management support is needed unless otherwise documented below in the visit note. 

## 2015-04-07 NOTE — Addendum Note (Signed)
Addended by: Sheliah HatchABORI, Joline Encalada E on: 04/07/2015 02:33 PM   Modules accepted: Orders

## 2015-04-07 NOTE — Telephone Encounter (Signed)
Relation to HQ:IONGpt:self Call back number:[phone]   Reason for call:  Patient requesting any medication prescribed [facility] to  [facility] - , [place] - [phone] (Phone) [phone] (Fax)

## 2015-04-07 NOTE — Addendum Note (Signed)
Addended by: Eustace QuailEABOLD, Lexus Shampine J on: 04/07/2015 02:51 PM   Modules accepted: Orders

## 2015-04-07 NOTE — Patient Instructions (Signed)
Follow up as needed/scheduled We'll notify you of your lab results and make any changes if needed Call with any questions or concerns If you want to join us at the new [place] office, any scheduled appointments will automatically transfer and we will see you at [address] [place], [place], [place] [id number] (OPENING FEB) Happy New Year!!

## 2015-04-07 NOTE — Assessment & Plan Note (Signed)
Recurrent issue for pt.  Hx of BV.  Pt reports d/c is not similar to previous.  No new sexual partners but will check GC/CT for completeness.  Hold on tx until lab results available.  Pt expressed understanding and is in agreement w/ plan.

## 2015-04-07 NOTE — Progress Notes (Signed)
   Subjective:    Patient ID: Shannon Anderson, female    DOB: [birth year], 30 y.o.   MRN: [id number]  HPI Vaginal d/c- sxs started 'a couple of weeks ago'.  No burning w/ urination.  D/c is yellow green.  No odor.  No vaginal itching.  No new sexual partners.  No pelvic or vaginal pain.   Review of Systems For ROS see HPI     Objective:   Physical Exam  Constitutional: She is oriented to person, place, and time. She appears well-developed and well-nourished. No distress.  HENT:  Head: Normocephalic and atraumatic.  Genitourinary: Vaginal discharge (milky d/c) found.  No labial lesions or rash noted  Neurological: She is alert and oriented to person, place, and time.  Skin: Skin is warm and dry.  Psychiatric: She has a normal mood and affect. Her behavior is normal. Thought content normal.  Vitals reviewed.         Assessment & Plan:

## 2015-04-08 LAB — URINE CYTOLOGY ANCILLARY ONLY
Chlamydia: NEGATIVE
Neisseria Gonorrhea: NEGATIVE

## 2015-04-08 LAB — CERVICOVAGINAL ANCILLARY ONLY: Wet Prep (BD Affirm): POSITIVE — AB

## 2015-04-08 MED ORDER — METRONIDAZOLE 500 MG PO TABS: 500.0000 mg | ORAL_TABLET | Freq: Two times a day (BID) | ORAL | Status: DC

## 2015-04-08 MED FILL — metroNIDAZOLE 500 MG TABS: 500 | 7 days supply | Qty: 14 | Fill #0

## 2015-04-08 NOTE — Telephone Encounter (Signed)
Pt  Was notified and Rx was sent to pharmacy.

## 2015-04-08 NOTE — Telephone Encounter (Signed)
Pt received mychart msg to start flagyl and [facility] but they do not have RX. Per med history nothing was sent.

## 2015-05-04 ENCOUNTER — Telehealth: Payer: Self-pay | Admitting: *Deleted

## 2015-05-04 NOTE — Telephone Encounter (Signed)
Unable to reach patient at time of pre-visit call. Left message for patient to return call when available.  

## 2015-05-05 ENCOUNTER — Other Ambulatory Visit (HOSPITAL_COMMUNITY)
Admission: RE | Admit: 2015-05-05 | Discharge: 2015-05-05 | Disposition: A | Discharge status: Home / Self Care | Payer: BC Managed Care – PPO | Source: Ambulatory Visit | Attending: Family Medicine | Admitting: Family Medicine

## 2015-05-05 ENCOUNTER — Ambulatory Visit (INDEPENDENT_AMBULATORY_CARE_PROVIDER_SITE_OTHER): Payer: BC Managed Care – PPO | Admitting: Family Medicine

## 2015-05-05 ENCOUNTER — Other Ambulatory Visit: Payer: Self-pay | Admitting: Family Medicine

## 2015-05-05 ENCOUNTER — Encounter: Payer: Self-pay | Admitting: Family Medicine

## 2015-05-05 VITALS — BP 104/80 | HR 65 | Temp 97.0°F | Ht 67.0 in | Wt 226.0 lb

## 2015-05-05 DIAGNOSIS — Z202 Contact with and (suspected) exposure to infections with a predominantly sexual mode of transmission: Secondary | ICD-10-CM | POA: Diagnosis not present

## 2015-05-05 DIAGNOSIS — Z01419 Encounter for gynecological examination (general) (routine) without abnormal findings: Secondary | ICD-10-CM | POA: Insufficient documentation

## 2015-05-05 DIAGNOSIS — Z803 Family history of malignant neoplasm of breast: Secondary | ICD-10-CM

## 2015-05-05 DIAGNOSIS — Z0000 Encounter for general adult medical examination without abnormal findings: Secondary | ICD-10-CM | POA: Diagnosis not present

## 2015-05-05 DIAGNOSIS — Z124 Encounter for screening for malignant neoplasm of cervix: Secondary | ICD-10-CM | POA: Diagnosis not present

## 2015-05-05 DIAGNOSIS — N760 Acute vaginitis: Secondary | ICD-10-CM | POA: Diagnosis present

## 2015-05-05 DIAGNOSIS — Z1151 Encounter for screening for human papillomavirus (HPV): Secondary | ICD-10-CM | POA: Diagnosis not present

## 2015-05-05 DIAGNOSIS — Z1231 Encounter for screening mammogram for malignant neoplasm of breast: Secondary | ICD-10-CM

## 2015-05-05 LAB — HEPATIC FUNCTION PANEL
ALK PHOS: 41 U/L (ref 39–117)
ALT: 13 U/L (ref 0–35)
AST: 15 U/L (ref 0–37)
Albumin: 3.8 g/dL (ref 3.5–5.2)
BILIRUBIN TOTAL: 0.3 mg/dL (ref 0.2–1.2)
Bilirubin, Direct: 0.1 mg/dL (ref 0.0–0.3)
Total Protein: 7.2 g/dL (ref 6.0–8.3)

## 2015-05-05 LAB — HIV ANTIBODY (ROUTINE TESTING W REFLEX): HIV 1&2 Ab, 4th Generation: NONREACTIVE

## 2015-05-05 LAB — CBC WITH DIFFERENTIAL/PLATELET
Basophils Absolute: 0 10*3/uL (ref 0.0–0.1)
Basophils Relative: 0.4 % (ref 0.0–3.0)
EOS ABS: 0.1 10*3/uL (ref 0.0–0.7)
Eosinophils Relative: 0.9 % (ref 0.0–5.0)
HEMATOCRIT: 38.1 % (ref 36.0–46.0)
Hemoglobin: 12.3 g/dL (ref 12.0–15.0)
LYMPHS ABS: 2.6 10*3/uL (ref 0.7–4.0)
LYMPHS PCT: 37.2 % (ref 12.0–46.0)
MCHC: 32.2 g/dL (ref 30.0–36.0)
MCV: 84.4 fl (ref 78.0–100.0)
Monocytes Absolute: 0.4 10*3/uL (ref 0.1–1.0)
Monocytes Relative: 5.6 % (ref 3.0–12.0)
NEUTROS ABS: 3.8 10*3/uL (ref 1.4–7.7)
NEUTROS PCT: 55.9 % (ref 43.0–77.0)
PLATELETS: 328 10*3/uL (ref 150.0–400.0)
RBC: 4.52 Mil/uL (ref 3.87–5.11)
RDW: 14.6 % (ref 11.5–15.5)
WBC: 6.9 10*3/uL (ref 4.0–10.5)

## 2015-05-05 LAB — BASIC METABOLIC PANEL
BUN: 10 mg/dL (ref 6–23)
CALCIUM: 9 mg/dL (ref 8.4–10.5)
CO2: 26 mEq/L (ref 19–32)
Chloride: 105 mEq/L (ref 96–112)
Creatinine, Ser: 0.95 mg/dL (ref 0.40–1.20)
GFR: 89.3 mL/min (ref >60.00)
Glucose, Bld: 95 mg/dL (ref 70–99)
Potassium: 3.8 mEq/L (ref 3.5–5.1)
SODIUM: 136 meq/L (ref 135–145)

## 2015-05-05 LAB — VITAMIN D 25 HYDROXY (VIT D DEFICIENCY, FRACTURES): VITD: 21.26 ng/mL — ABNORMAL LOW (ref 30.00–100.00)

## 2015-05-05 LAB — LIPID PANEL
CHOL/HDL RATIO: 3
Cholesterol: 156 mg/dL (ref 0–200)
HDL: 44.7 mg/dL (ref >39.00)
LDL Cholesterol: 102 mg/dL — ABNORMAL HIGH (ref 0–99)
NONHDL: 110.82
Triglycerides: 43 mg/dL (ref 0.0–149.0)
VLDL: 8.6 mg/dL (ref 0.0–40.0)

## 2015-05-05 LAB — TSH: TSH: 0.6 u[IU]/mL (ref 0.35–4.50)

## 2015-05-05 MED ORDER — PHENTERMINE-TOPIRAMATE ER 7.5-46 MG PO CP24: 1.0000 | ORAL_CAPSULE | Freq: Every day | ORAL | Status: DC

## 2015-05-05 MED ORDER — PHENTERMINE-TOPIRAMATE ER 3.75-23 MG PO CP24: 1.0000 | ORAL_CAPSULE | Freq: Every day | ORAL | Status: DC

## 2015-05-05 MED FILL — QSYMIA 3.75 MG-23 MG CAP: 3.75-23 | 14 days supply | Qty: 14 | Fill #0

## 2015-05-05 NOTE — Patient Instructions (Signed)
Follow up in 6 weeks to recheck weight loss progress on Qsymia We'll notify you of your lab results and make any changes if needed Continue to work on healthy diet and regular exercise- you can do it! [facility] with [facility] mamm[facility] appt Call with any questions or concerns Have a great weekend!!!

## 2015-05-05 NOTE — Progress Notes (Signed)
Pre visit review using our clinic review tool, if applicable. No additional management support is needed unless otherwise documented below in the visit note. 

## 2015-05-05 NOTE — Progress Notes (Signed)
   Subjective:    Patient ID: Shannon Anderson, female    DOB: 02/24/1986, 30 y.o.   MRN: [id number]  HPI CPE- due for pap.  Pt is having a difficult time losing weight due to working night shift.  Pt reports working out but no weight loss.  Interested in starting Qsymia   Review of Systems Patient reports no vision/ hearing changes, adenopathy,fever, weight change,  persistant/recurrent hoarseness , swallowing issues, chest pain, palpitations, edema, persistant/recurrent cough, hemoptysis, dyspnea (rest/exertional/paroxysmal nocturnal), gastrointestinal bleeding (melena, rectal bleeding), abdominal pain, significant heartburn, bowel changes, GU symptoms (dysuria, hematuria, incontinence), Gyn symptoms (abnormal  bleeding, pain),  syncope, focal weakness, memory loss, numbness & tingling, skin/hair/nail changes, abnormal bruising or bleeding, anxiety, or depression.     Objective:   Physical Exam  General Appearance:    Alert, cooperative, no distress, appears stated age  Head:    Normocephalic, without obvious abnormality, atraumatic  Eyes:    PERRL, conjunctiva/corneas clear, EOM's intact, fundi    benign, both eyes  Ears:    Normal TM's and external ear canals, both ears  Nose:   Nares normal, septum midline, mucosa normal, no drainage    or sinus tenderness  Throat:   Lips, mucosa, and tongue normal; teeth and gums normal  Neck:   Supple, symmetrical, trachea midline, no adenopathy;    Thyroid: no enlargement/tenderness/nodules  Back:     Symmetric, no curvature, ROM normal, no CVA tenderness  Lungs:     Clear to auscultation bilaterally, respirations unlabored  Chest Wall:    No tenderness or deformity   Heart:    Regular rate and rhythm, S1 and S2 normal, no murmur, rub   or gallop  Breast Exam:    No tenderness, masses, or nipple abnormality  Abdomen:     Soft, non-tender, bowel sounds active all four quadrants,    no masses, no organomegaly  Genitalia:    External genitalia  normal, cervix normal in appearance, no CMT, uterus in normal size and position, adnexa w/out mass or tenderness, mucosa pink and moist, no lesions or discharge present  Rectal:    Normal external appearance  Extremities:   Extremities normal, atraumatic, no cyanosis or edema  Pulses:   2+ and symmetric all extremities  Skin:   Skin color, texture, turgor normal, no rashes or lesions  Lymph nodes:   Cervical, supraclavicular, and axillary nodes normal  Neurologic:   CNII-XII intact, normal strength, sensation and reflexes    throughout          Assessment & Plan:

## 2015-05-06 ENCOUNTER — Other Ambulatory Visit: Payer: Self-pay | Admitting: General Practice

## 2015-05-06 LAB — HSV(HERPES SMPLX)ABS-I+II(IGG+IGM)-BLD
HERPES SIMPLEX VRS I-IGM AB (EIA): 7.47 {index} — AB
HSV 1 Glycoprotein G Ab, IgG: 11.4 IV — ABNORMAL HIGH
HSV 2 Glycoprotein G Ab, IgG: <0.1 IV

## 2015-05-06 LAB — RPR

## 2015-05-06 LAB — CYTOLOGY - PAP

## 2015-05-06 MED ORDER — RIZATRIPTAN BENZOATE 10 MG PO TABS: ORAL_TABLET | ORAL | Status: DC

## 2015-05-06 MED ORDER — VITAMIN D (ERGOCALCIFEROL) 1.25 MG (50000 UNIT) PO CAPS: 50000.0000 [IU] | ORAL_CAPSULE | ORAL | Status: DC

## 2015-05-09 ENCOUNTER — Other Ambulatory Visit: Payer: Self-pay | Admitting: Family Medicine

## 2015-05-09 MED ORDER — FLUCONAZOLE 150 MG PO TABS: 150.0000 mg | ORAL_TABLET | Freq: Once | ORAL | Status: DC

## 2015-05-11 ENCOUNTER — Telehealth: Payer: Self-pay | Admitting: Family Medicine

## 2015-05-11 LAB — CERVICOVAGINAL ANCILLARY ONLY
Bacterial vaginitis: POSITIVE — AB
Candida vaginitis: POSITIVE — AB

## 2015-05-11 NOTE — Telephone Encounter (Signed)
Labs look amazing.  Creatinine and GFR fluctuate regularly but as long as they remain in the normal range (which they are).  No worries at this time

## 2015-05-11 NOTE — Telephone Encounter (Signed)
Patient notified

## 2015-05-11 NOTE — Telephone Encounter (Signed)
Patient concerned about GFR and Creatinine. Would like to know if there was something she could do at this point. Advised that if you felt they were a concern you would have mentioned. They were both within normal range. Please advise.

## 2015-05-11 NOTE — Telephone Encounter (Signed)
Pt is requesting a call back from the CMA because she noticed that a few of her levels on her labs have changed.      CB: [phone]

## 2015-05-12 ENCOUNTER — Telehealth: Payer: Self-pay | Admitting: Family Medicine

## 2015-05-12 ENCOUNTER — Other Ambulatory Visit: Payer: Self-pay | Admitting: General Practice

## 2015-05-12 ENCOUNTER — Other Ambulatory Visit: Payer: Self-pay

## 2015-05-12 MED ORDER — METRONIDAZOLE 500 MG PO TABS: 500.0000 mg | ORAL_TABLET | Freq: Two times a day (BID) | ORAL | Status: DC

## 2015-05-12 MED ORDER — METRONIDAZOLE 500 MG PO TABS: 500.0000 mg | ORAL_TABLET | Freq: Two times a day (BID) | ORAL | Status: AC

## 2015-05-12 NOTE — Telephone Encounter (Signed)
Would still start with flagyl and we can change treatment if symptoms don't improve

## 2015-05-12 NOTE — Telephone Encounter (Signed)
RX faxed to pharmacy. Patient notiifed advised medication could be changed is necessary per Dr. Beverely Low.

## 2015-05-12 NOTE — Telephone Encounter (Signed)
Reason for call: Pt said she had BV a month ago and treated with Flagyl but it didn't go away 100%. She is wondering if 2nd round of Flagyl with work or if she should try something else?  Best# [phone]

## 2015-05-23 ENCOUNTER — Telehealth: Payer: Self-pay | Admitting: General Practice

## 2015-05-23 NOTE — Telephone Encounter (Signed)
Received a PA for Qsymia (paper copy). Started today, given to PCP.

## 2015-05-24 NOTE — Telephone Encounter (Signed)
Per PCP she is not going through with PA. PCP is recommending pt contact her insurance to either: 1.) verify what if any weight loss medications they will cover, or 2.) pt will have to pay out of pocket for medication.

## 2015-05-24 NOTE — Telephone Encounter (Signed)
Called pt and LMVOM to inform that PA was denied, and gave the pt her two options.

## 2015-05-25 NOTE — Telephone Encounter (Signed)
Ok for pt to start phentermine 37.5mg  daily #30, 2 refills.  This is only meant to be a short term medication, for 12 weeks at a time, and is meant to be used in conjunction with healthy diet and regular exercise.

## 2015-05-25 NOTE — Telephone Encounter (Signed)
Pt says that she called the insurance company and was advised that they will not cover any thing for weight loss. Pt is suggested phentermine because she says that it is less out of pocket. She would like to be advised further.      [facility]: [facility] [address]  CB: [phone]

## 2015-05-26 MED ORDER — PHENTERMINE HCL 37.5 MG PO CAPS: 37.5000 mg | ORAL_CAPSULE | ORAL | Status: DC

## 2015-05-26 NOTE — Telephone Encounter (Signed)
Pt aware and expressed an understanding. Med filled to pharmacy as requested.

## 2015-05-27 NOTE — Telephone Encounter (Signed)
Received Denial on Phentermine; LMOM with contact name and number [for return call, if needed] RE: option is to purchase at pharmacy OOP now if she chooses/SLS 02/24

## 2015-05-31 NOTE — Assessment & Plan Note (Signed)
Pt's PE WNL w/ exception of obesity.  Check labs.  Anticipatory guidance provided.  

## 2015-05-31 NOTE — Assessment & Plan Note (Signed)
Check labs.  tx prn.

## 2015-06-16 ENCOUNTER — Ambulatory Visit: Payer: BC Managed Care – PPO | Admitting: Family Medicine

## 2015-08-16 ENCOUNTER — Encounter: Payer: Self-pay | Admitting: Family Medicine

## 2015-08-16 ENCOUNTER — Ambulatory Visit (INDEPENDENT_AMBULATORY_CARE_PROVIDER_SITE_OTHER): Payer: BC Managed Care – PPO | Admitting: Family Medicine

## 2015-08-16 VITALS — BP 120/80 | HR 64 | Temp 98.1°F | Resp 16 | Ht 67.0 in | Wt 232.0 lb

## 2015-08-16 DIAGNOSIS — R5383 Other fatigue: Secondary | ICD-10-CM | POA: Diagnosis not present

## 2015-08-16 DIAGNOSIS — F329 Major depressive disorder, single episode, unspecified: Secondary | ICD-10-CM | POA: Diagnosis not present

## 2015-08-16 DIAGNOSIS — F32A Depression, unspecified: Secondary | ICD-10-CM

## 2015-08-16 LAB — CBC WITH DIFFERENTIAL/PLATELET
BASOS PCT: 0.3 % (ref 0.0–3.0)
Basophils Absolute: 0 10*3/uL (ref 0.0–0.1)
Eosinophils Absolute: 0.1 10*3/uL (ref 0.0–0.7)
Eosinophils Relative: 0.7 % (ref 0.0–5.0)
HEMATOCRIT: 37.3 % (ref 36.0–46.0)
Hemoglobin: 12.4 g/dL (ref 12.0–15.0)
LYMPHS PCT: 37.2 % (ref 12.0–46.0)
Lymphs Abs: 3.4 10*3/uL (ref 0.7–4.0)
MCHC: 33.2 g/dL (ref 30.0–36.0)
MCV: 82.3 fl (ref 78.0–100.0)
MONOS PCT: 4.6 % (ref 3.0–12.0)
Monocytes Absolute: 0.4 10*3/uL (ref 0.1–1.0)
NEUTROS ABS: 5.2 10*3/uL (ref 1.4–7.7)
Neutrophils Relative %: 57.2 % (ref 43.0–77.0)
PLATELETS: 315 10*3/uL (ref 150.0–400.0)
RBC: 4.53 Mil/uL (ref 3.87–5.11)
RDW: 14.5 % (ref 11.5–15.5)
WBC: 9 10*3/uL (ref 4.0–10.5)

## 2015-08-16 LAB — BASIC METABOLIC PANEL
BUN: 10 mg/dL (ref 6–23)
CO2: 28 meq/L (ref 19–32)
CREATININE: 0.83 mg/dL (ref 0.40–1.20)
Calcium: 9.5 mg/dL (ref 8.4–10.5)
Chloride: 102 mEq/L (ref 96–112)
GFR: 104.16 mL/min (ref >60.00)
Glucose, Bld: 91 mg/dL (ref 70–99)
Potassium: 3.9 mEq/L (ref 3.5–5.1)
SODIUM: 136 meq/L (ref 135–145)

## 2015-08-16 LAB — HEPATIC FUNCTION PANEL
ALT: 21 U/L (ref 0–35)
AST: 17 U/L (ref 0–37)
Albumin: 4.2 g/dL (ref 3.5–5.2)
Alkaline Phosphatase: 47 U/L (ref 39–117)
BILIRUBIN DIRECT: 0 mg/dL (ref 0.0–0.3)
BILIRUBIN TOTAL: 0.3 mg/dL (ref 0.2–1.2)
Total Protein: 7.5 g/dL (ref 6.0–8.3)

## 2015-08-16 LAB — VITAMIN D 25 HYDROXY (VIT D DEFICIENCY, FRACTURES): VITD: 31.22 ng/mL (ref 30.00–100.00)

## 2015-08-16 LAB — B12 AND FOLATE PANEL
FOLATE: 21.4 ng/mL (ref >5.9)
Vitamin B-12: 452 pg/mL (ref 211–911)

## 2015-08-16 LAB — TSH: TSH: 1.34 u[IU]/mL (ref 0.35–4.50)

## 2015-08-16 MED ORDER — BUPROPION HCL ER (XL) 150 MG PO TB24: 150.0000 mg | ORAL_TABLET | Freq: Every day | ORAL | Status: DC

## 2015-08-16 NOTE — Progress Notes (Signed)
Pre visit review using our clinic review tool, if applicable. No additional management support is needed unless otherwise documented below in the visit note. 

## 2015-08-16 NOTE — Patient Instructions (Signed)
Follow up in 3-4 weeks to recheck mood and energy level We'll notify you of your lab results and make any changes if needed Continue to work on healthy diet and regular exercise- you can do it! Start the Wellbutrin daily to improve mood Call with any questions or concerns Hang in there!! Have a great trip!!!

## 2015-08-16 NOTE — Progress Notes (Signed)
   Subjective:    Patient ID: Shannon Anderson, female    DOB: 03-06-1986, 30 y.o.   MRN: [id number]  HPI Depression- ongoing issue for pt.  She has previously been on Celexa, Effexor, Prozac.  Did not do well on Wellbutrin from an outside perspective but she states she felt 'great' on meds.  Pt scored a 13 on depression scale today.  Increased fatigue.  Difficulty w/ focus, low motivation.  'i'm just blah'.  + sadness, increased stress.  + vaginal spotting but no regular periods- negative home pregnancy tests.  Taking daily MVI, D3.  + increased irritability.  + weight gain- 6 lbs.   Review of Systems For ROS see HPI     Objective:   Physical Exam  Constitutional: She is oriented to person, place, and time. She appears well-developed and well-nourished. No distress.  HENT:  Head: Normocephalic and atraumatic.  Eyes: Conjunctivae and EOM are normal. Pupils are equal, round, and reactive to light.  Neck: Normal range of motion. Neck supple. Thyromegaly present.  Cardiovascular: Normal rate, regular rhythm, normal heart sounds and intact distal pulses.   No murmur heard. Pulmonary/Chest: Effort normal and breath sounds normal. No respiratory distress.  Abdominal: Soft. She exhibits no distension. There is no tenderness.  Musculoskeletal: She exhibits no edema.  Lymphadenopathy:    She has no cervical adenopathy.  Neurological: She is alert and oriented to person, place, and time.  Skin: Skin is warm and dry.  Psychiatric: She has a normal mood and affect. Her behavior is normal.  Vitals reviewed.         Assessment & Plan:

## 2015-08-16 NOTE — Assessment & Plan Note (Signed)
Recurrent problem for pt.  She reports she felt best on the Wellbutrin although this seemed to be too much (pressured speech, psychomotor agitation) from an outside perspective.  Willing to restart medication and monitor closely for sxs improvement.  Pt denies SI/HI.  Will follow.

## 2015-08-16 NOTE — Assessment & Plan Note (Signed)
New.  Suspect that this is related to pt's depression but must r/o metabolic causes of fatigue or possible pregnancy given her spotting and irregular periods.  Check labs and treat any abnormalities if present.  Start tx for underlying depression.  Will follow.

## 2015-08-19 LAB — BETA HCG QUANT (REF LAB): Beta hCG, Tumor Marker: <2 m[IU]/mL (ref <5.0)

## 2015-09-22 ENCOUNTER — Encounter: Payer: Self-pay | Admitting: Family Medicine

## 2015-09-22 ENCOUNTER — Ambulatory Visit (INDEPENDENT_AMBULATORY_CARE_PROVIDER_SITE_OTHER): Payer: BC Managed Care – PPO | Admitting: Family Medicine

## 2015-09-22 VITALS — BP 118/78 | HR 72 | Temp 97.9°F | Resp 16 | Ht 67.0 in | Wt 234.2 lb

## 2015-09-22 DIAGNOSIS — F329 Major depressive disorder, single episode, unspecified: Secondary | ICD-10-CM | POA: Diagnosis not present

## 2015-09-22 DIAGNOSIS — F32A Depression, unspecified: Secondary | ICD-10-CM

## 2015-09-22 MED ORDER — BUPROPION HCL ER (XL) 300 MG PO TB24: 300.0000 mg | ORAL_TABLET | Freq: Every day | ORAL | Status: DC

## 2015-09-22 NOTE — Progress Notes (Signed)
Pre visit review using our clinic review tool, if applicable. No additional management support is needed unless otherwise documented below in the visit note. 

## 2015-09-22 NOTE — Patient Instructions (Signed)
Follow up in 4-6 weeks to recheck anxiety/depression Increase the Wellbutrin to 300mg  daily- 2 of what you have at home and 1 of the new prescription Continue to work on healthy diet and regular exercise- you can do it! Call with any questions or concerns Have a great summer!!!

## 2015-09-22 NOTE — Progress Notes (Signed)
   Subjective:    Patient ID: Shannon Anderson, female    DOB: 11/05/1985, 30 y.o.   MRN: [id number]  HPI Anxiety/depression- pt restarted Wellbutrin at last visit.  Reports feeling 'a little better'.  Not jittery like last time.  Has not had the rapid weight loss.  Pt reports improved energy- exercising, eating better.  Reports that she still feels 'blah'.     Review of Systems For ROS see HPI     Objective:   Physical Exam  Constitutional: She is oriented to person, place, and time. She appears well-developed and well-nourished. No distress.  HENT:  Head: Normocephalic and atraumatic.  Neurological: She is alert and oriented to person, place, and time.  Skin: Skin is warm and dry.  Psychiatric: She has a normal mood and affect. Her behavior is normal. Thought content normal.  Vitals reviewed.         Assessment & Plan:

## 2015-09-22 NOTE — Assessment & Plan Note (Signed)
Improving but mood is still not at goal for pt.  Will increase Wellbutrin to 300mg  daily and monitor for improvement.  Reviewed supportive care and red flags that should prompt return.  Pt expressed understanding and is in agreement w/ plan.

## 2015-10-21 ENCOUNTER — Telehealth: Payer: Self-pay | Admitting: Family Medicine

## 2015-10-21 DIAGNOSIS — K219 Gastro-esophageal reflux disease without esophagitis: Secondary | ICD-10-CM

## 2015-10-21 NOTE — Telephone Encounter (Signed)
Ok for referral?

## 2015-10-21 NOTE — Telephone Encounter (Signed)
Referral placed.

## 2015-10-21 NOTE — Telephone Encounter (Signed)
Patient states she was seen in the ED and diagnosed with GERD.  She is requesting a [name].  Tel # [phone]

## 2015-10-21 NOTE — Telephone Encounter (Signed)
Ok for [facility] referral dx reflux

## 2015-10-24 ENCOUNTER — Encounter: Payer: Self-pay | Admitting: Family Medicine

## 2015-10-24 ENCOUNTER — Ambulatory Visit (INDEPENDENT_AMBULATORY_CARE_PROVIDER_SITE_OTHER): Payer: BC Managed Care – PPO | Admitting: Family Medicine

## 2015-10-24 VITALS — BP 116/78 | HR 80 | Temp 98.0°F | Resp 16 | Ht 67.0 in | Wt 228.4 lb

## 2015-10-24 DIAGNOSIS — F32A Depression, unspecified: Secondary | ICD-10-CM

## 2015-10-24 DIAGNOSIS — F329 Major depressive disorder, single episode, unspecified: Secondary | ICD-10-CM | POA: Diagnosis not present

## 2015-10-24 DIAGNOSIS — K219 Gastro-esophageal reflux disease without esophagitis: Secondary | ICD-10-CM | POA: Diagnosis not present

## 2015-10-24 NOTE — Progress Notes (Signed)
   Subjective:    Patient ID: Shannon Anderson, female    DOB: [birth year], 30 y.o.   MRN: [id number]  HPI Anxiety/depression- pt's Wellbutrin was increased to 300mg  last visit.  Pt is down 6 lbs from last visit.  Less anxious, less sad and overwhelmed.  Able to focus a little better.  Less 'mopey'  GERD- new.  Pt went to UC and was dx'd w/ GERD.  Started on PPI and carafate.  Pt is currently on soft diet.  Asking to stop carafate and advance diet.  Has GI f/u scheduled but isn't sure if she should go.          Review of Systems For ROS see HPI     Objective:   Physical Exam  Constitutional: She is oriented to person, place, and time. She appears well-developed and well-nourished. No distress.  HENT:  Head: Normocephalic and atraumatic.  Eyes: Conjunctivae and EOM are normal. Pupils are equal, round, and reactive to light.  Abdominal: Soft. Bowel sounds are normal. She exhibits no distension. There is no tenderness. There is no rebound and no guarding.  Neurological: She is alert and oriented to person, place, and time.  Skin: Skin is warm and dry.  Psychiatric: She has a normal mood and affect. Her behavior is normal. Thought content normal.  Vitals reviewed.         Assessment & Plan:

## 2015-10-24 NOTE — Patient Instructions (Addendum)
Schedule your complete physical for Feb Continue the Prilosec daily to help w/ GERD STOP the Carafate and see how your stomach feels.  If you develop pain, restart the medication Resume your normal diet but be mindful that caffeine, alcohol, and spicy foods will worsen reflux Continue the Wellbutrin daily Call with any questions or concerns Have a great summer!!!

## 2015-10-24 NOTE — Assessment & Plan Note (Signed)
New.  Pt was dx'd at [facility] and started on PPI and carafate.  She wants to stop carafate and liberalize her diet.  ok'd both assuming that her pain doesn't return.  Discussed dietary and lifestyle modifications.  Will follow.

## 2015-10-24 NOTE — Progress Notes (Signed)
Pre visit review using our clinic review tool, if applicable. No additional management support is needed unless otherwise documented below in the visit note. 

## 2015-10-24 NOTE — Assessment & Plan Note (Signed)
Improved w/ increased dose of Wellbutrin.  No changes at this time.  Will follow.

## 2015-11-30 ENCOUNTER — Telehealth: Payer: Self-pay | Admitting: Family Medicine

## 2015-11-30 MED ORDER — SUVOREXANT 20 MG PO TABS: 1.0000 | ORAL_TABLET | Freq: Every day | ORAL | 3 refills | Status: DC

## 2015-11-30 NOTE — Telephone Encounter (Signed)
Pt states that she is taking 3 trazodone and still not able to stay asleep. Pt states maybe 4 hrs. Pt asking if she should try something different.

## 2015-11-30 NOTE — Telephone Encounter (Signed)
Patient notified of PCP recommendations and is agreement and expresses an understanding. Voucher and copay card placed at front desk with Rx.

## 2015-11-30 NOTE — Telephone Encounter (Signed)
We will STOP the trazodone if it's not working and start Belsomra 20mg  nightly.  This is not meant to make her feel drugged or excessively drowsy but works over the course of 7 days to regulate the sleep/wake cycle and turn down the inappropriate wakefulness.  We have vouchers and copay cards available.  Script for #30, 3 refills

## 2016-01-03 ENCOUNTER — Encounter: Payer: Self-pay | Admitting: General Practice

## 2016-01-03 ENCOUNTER — Telehealth: Payer: Self-pay | Admitting: Family Medicine

## 2016-01-03 MED ORDER — BUPROPION HCL ER (XL) 300 MG PO TB24: 300.0000 mg | ORAL_TABLET | Freq: Every day | ORAL | 3 refills | Status: DC

## 2016-01-03 NOTE — Telephone Encounter (Signed)
Medication filled to pharmacy as requested.   

## 2016-01-03 NOTE — Telephone Encounter (Signed)
Pt states that she needs refill on wellbutrin,

## 2016-01-20 ENCOUNTER — Encounter: Payer: Self-pay | Admitting: Family Medicine

## 2016-01-20 DIAGNOSIS — E669 Obesity, unspecified: Secondary | ICD-10-CM

## 2016-01-20 NOTE — Telephone Encounter (Signed)
Referral to external Nutrition placed today.

## 2016-04-03 ENCOUNTER — Other Ambulatory Visit: Payer: Self-pay | Admitting: Family Medicine

## 2016-04-04 ENCOUNTER — Telehealth: Payer: Self-pay | Admitting: General Practice

## 2016-04-04 NOTE — Telephone Encounter (Signed)
PA began with [facility] - PA Case ID: [id number])

## 2016-04-04 NOTE — Telephone Encounter (Signed)
Belsomra medication approved from 04/04/2016 to 04/05/2019

## 2016-05-08 ENCOUNTER — Encounter: Payer: BC Managed Care – PPO | Admitting: Family Medicine

## 2016-05-28 ENCOUNTER — Other Ambulatory Visit: Payer: Self-pay | Admitting: Family Medicine

## 2016-09-03 ENCOUNTER — Other Ambulatory Visit (HOSPITAL_COMMUNITY)
Admission: RE | Admit: 2016-09-03 | Discharge: 2016-09-03 | Disposition: A | Discharge status: Home / Self Care | Payer: BLUE CROSS/BLUE SHIELD | Source: Ambulatory Visit | Attending: Family Medicine | Admitting: Family Medicine

## 2016-09-03 ENCOUNTER — Ambulatory Visit (INDEPENDENT_AMBULATORY_CARE_PROVIDER_SITE_OTHER): Payer: BLUE CROSS/BLUE SHIELD | Admitting: Family Medicine

## 2016-09-03 ENCOUNTER — Encounter: Payer: Self-pay | Admitting: Family Medicine

## 2016-09-03 VITALS — BP 121/72 | HR 78 | Temp 98.7°F | Resp 16 | Ht 67.0 in | Wt 223.1 lb

## 2016-09-03 DIAGNOSIS — Z202 Contact with and (suspected) exposure to infections with a predominantly sexual mode of transmission: Secondary | ICD-10-CM

## 2016-09-03 DIAGNOSIS — E010 Iodine-deficiency related diffuse (endemic) goiter: Secondary | ICD-10-CM | POA: Diagnosis not present

## 2016-09-03 DIAGNOSIS — E559 Vitamin D deficiency, unspecified: Secondary | ICD-10-CM | POA: Diagnosis not present

## 2016-09-03 DIAGNOSIS — Z803 Family history of malignant neoplasm of breast: Secondary | ICD-10-CM

## 2016-09-03 DIAGNOSIS — Z0000 Encounter for general adult medical examination without abnormal findings: Secondary | ICD-10-CM

## 2016-09-03 LAB — BASIC METABOLIC PANEL
BUN: 10 mg/dL (ref 6–23)
CO2: 28 mEq/L (ref 19–32)
CREATININE: 0.87 mg/dL (ref 0.40–1.20)
Calcium: 9.5 mg/dL (ref 8.4–10.5)
Chloride: 103 mEq/L (ref 96–112)
GFR: 97.95 mL/min (ref >60.00)
Glucose, Bld: 95 mg/dL (ref 70–99)
POTASSIUM: 4.4 meq/L (ref 3.5–5.1)
Sodium: 137 mEq/L (ref 135–145)

## 2016-09-03 LAB — LIPID PANEL
CHOLESTEROL: 149 mg/dL (ref 0–200)
HDL: 39.1 mg/dL (ref >39.00)
LDL CALC: 95 mg/dL (ref 0–99)
NonHDL: 109.68
TRIGLYCERIDES: 74 mg/dL (ref 0.0–149.0)
Total CHOL/HDL Ratio: 4
VLDL: 14.8 mg/dL (ref 0.0–40.0)

## 2016-09-03 LAB — HEPATIC FUNCTION PANEL
ALBUMIN: 4 g/dL (ref 3.5–5.2)
ALK PHOS: 48 U/L (ref 39–117)
ALT: 8 U/L (ref 0–35)
AST: 11 U/L (ref 0–37)
Bilirubin, Direct: 0.1 mg/dL (ref 0.0–0.3)
TOTAL PROTEIN: 6.8 g/dL (ref 6.0–8.3)
Total Bilirubin: 0.3 mg/dL (ref 0.2–1.2)

## 2016-09-03 LAB — CBC WITH DIFFERENTIAL/PLATELET
BASOS ABS: 0 10*3/uL (ref 0.0–0.1)
Basophils Relative: 0.5 % (ref 0.0–3.0)
Eosinophils Absolute: 0.1 10*3/uL (ref 0.0–0.7)
Eosinophils Relative: 1.2 % (ref 0.0–5.0)
HCT: 36.8 % (ref 36.0–46.0)
Hemoglobin: 12.1 g/dL (ref 12.0–15.0)
Lymphocytes Relative: 43.4 % (ref 12.0–46.0)
Lymphs Abs: 3.4 10*3/uL (ref 0.7–4.0)
MCHC: 33 g/dL (ref 30.0–36.0)
MCV: 84.1 fl (ref 78.0–100.0)
Monocytes Absolute: 0.5 10*3/uL (ref 0.1–1.0)
Monocytes Relative: 6.3 % (ref 3.0–12.0)
NEUTROS ABS: 3.8 10*3/uL (ref 1.4–7.7)
NEUTROS PCT: 48.6 % (ref 43.0–77.0)
PLATELETS: 327 10*3/uL (ref 150.0–400.0)
RBC: 4.37 Mil/uL (ref 3.87–5.11)
RDW: 14 % (ref 11.5–15.5)
WBC: 7.9 10*3/uL (ref 4.0–10.5)

## 2016-09-03 LAB — VITAMIN D 25 HYDROXY (VIT D DEFICIENCY, FRACTURES): VITD: 27.53 ng/mL — AB (ref 30.00–100.00)

## 2016-09-03 LAB — TSH: TSH: 1.64 u[IU]/mL (ref 0.35–4.50)

## 2016-09-03 MED ORDER — TRAZODONE HCL 50 MG PO TABS: 25.0000 mg | ORAL_TABLET | Freq: Every evening | ORAL | 3 refills | Status: DC | PRN

## 2016-09-03 NOTE — Patient Instructions (Addendum)
Follow up in 1 year or as needed We'll notify you of your lab results and make any changes if needed We'll call you with your mammogram Please consider calling Hospice for grief counseling [phone] Continue to work on healthy diet and regular exercise- you look great! Call with any questions or concerns Hang in there!  You can do this!!!

## 2016-09-03 NOTE — Progress Notes (Signed)
   Subjective:    Patient ID: Shannon Anderson, female    DOB: 06-Mar-1986, 31 y.o.   MRN: [id number]  HPI CPE- mom died a few weeks ago from breast cancer.     Review of Systems Patient reports no vision/ hearing changes, adenopathy,fever, weight change,  persistant/recurrent hoarseness , swallowing issues, chest pain, palpitations, edema, persistant/recurrent cough, hemoptysis, dyspnea (rest/exertional/paroxysmal nocturnal), gastrointestinal bleeding (melena, rectal bleeding), abdominal pain, significant heartburn, bowel changes, GU symptoms (dysuria, hematuria, incontinence), Gyn symptoms (abnormal  bleeding, pain),  syncope, focal weakness, memory loss, numbness & tingling, skin/hair/nail changes, abnormal bruising or bleeding.     Objective:   Physical Exam General Appearance:    Alert, cooperative, no distress, appears stated age  Head:    Normocephalic, without obvious abnormality, atraumatic  Eyes:    PERRL, conjunctiva/corneas clear, EOM's intact, fundi    benign, both eyes  Ears:    Normal TM's and external ear canals, both ears  Nose:   Nares normal, septum midline, mucosa normal, no drainage    or sinus tenderness  Throat:   Lips, mucosa, and tongue normal; teeth and gums normal  Neck:   Supple, symmetrical, trachea midline, no adenopathy;    Thyroid: no enlargement/tenderness/nodules  Back:     Symmetric, no curvature, ROM normal, no CVA tenderness  Lungs:     Clear to auscultation bilaterally, respirations unlabored  Chest Wall:    No tenderness or deformity   Heart:    Regular rate and rhythm, S1 and S2 normal, no murmur, rub   or gallop  Breast Exam:    Deferred to mammo  Abdomen:     Soft, non-tender, bowel sounds active all four quadrants,    no masses, no organomegaly  Genitalia:    Deferred  Rectal:    Extremities:   Extremities normal, atraumatic, no cyanosis or edema  Pulses:   2+ and symmetric all extremities  Skin:   Skin color, texture, turgor normal, no  rashes or lesions  Lymph nodes:   Cervical, supraclavicular, and axillary nodes normal  Neurologic:   CNII-XII intact, normal strength, sensation and reflexes    throughout          Assessment & Plan:

## 2016-09-03 NOTE — Assessment & Plan Note (Signed)
Pt's PE WNL w/ exception of obesity.  UTD on pap.  Will get mammo due to mom's recent metastatic breast cancer.  UTD on Tdap.  Check labs.  Anticipatory guidance provided.

## 2016-09-03 NOTE — Progress Notes (Signed)
Pre visit review using our clinic review tool, if applicable. No additional management support is needed unless otherwise documented below in the visit note. 

## 2016-09-03 NOTE — Assessment & Plan Note (Signed)
Unchanged from previous.  Check TSH.  Start meds prn.

## 2016-09-03 NOTE — Assessment & Plan Note (Signed)
Check labs.  Replete prn. 

## 2016-09-03 NOTE — Addendum Note (Signed)
Addended by: Sheliah HatchABORI, Amarii Bordas E on: 09/03/2016 09:50 AM   Modules accepted: Orders

## 2016-09-04 LAB — RPR

## 2016-09-04 LAB — URINE CYTOLOGY ANCILLARY ONLY
CHLAMYDIA, DNA PROBE: NEGATIVE
NEISSERIA GONORRHEA: NEGATIVE

## 2016-09-04 LAB — HIV ANTIBODY (ROUTINE TESTING W REFLEX): HIV 1&2 Ab, 4th Generation: NONREACTIVE

## 2016-09-05 ENCOUNTER — Encounter: Payer: Self-pay | Admitting: Family Medicine

## 2016-09-05 ENCOUNTER — Other Ambulatory Visit: Payer: Self-pay | Admitting: Family Medicine

## 2016-09-05 DIAGNOSIS — Z803 Family history of malignant neoplasm of breast: Secondary | ICD-10-CM

## 2016-09-05 DIAGNOSIS — Z1231 Encounter for screening mammogram for malignant neoplasm of breast: Secondary | ICD-10-CM

## 2016-09-06 MED ORDER — NORETHINDRONE ACET-ETHINYL EST 1-20 MG-MCG PO TABS: 1.0000 | ORAL_TABLET | Freq: Every day | ORAL | 11 refills | Status: DC

## 2016-09-18 ENCOUNTER — Ambulatory Visit
Admission: RE | Admit: 2016-09-18 | Discharge: 2016-09-18 | Disposition: A | Discharge status: Home / Self Care | Payer: BLUE CROSS/BLUE SHIELD | Source: Ambulatory Visit | Attending: Family Medicine | Admitting: Family Medicine

## 2016-09-18 DIAGNOSIS — Z1231 Encounter for screening mammogram for malignant neoplasm of breast: Secondary | ICD-10-CM

## 2016-09-18 DIAGNOSIS — Z803 Family history of malignant neoplasm of breast: Secondary | ICD-10-CM

## 2016-09-19 ENCOUNTER — Telehealth: Payer: Self-pay | Admitting: Family Medicine

## 2016-09-19 NOTE — Telephone Encounter (Signed)
Received fax from pt employer that needs to be completed, added charge sheet and placed in bin fro KT

## 2016-09-20 ENCOUNTER — Other Ambulatory Visit: Payer: Self-pay | Admitting: Family Medicine

## 2016-09-20 DIAGNOSIS — R928 Other abnormal and inconclusive findings on diagnostic imaging of breast: Secondary | ICD-10-CM

## 2016-09-21 NOTE — Telephone Encounter (Signed)
Form completed and placed in bin

## 2016-09-21 NOTE — Telephone Encounter (Signed)
Forms have been faxed, LM making pt aware.

## 2016-09-21 NOTE — Telephone Encounter (Signed)
Pt calling checking status on this, stating she needs this to be completed by Monday in order to start her job.

## 2016-09-28 ENCOUNTER — Ambulatory Visit
Admission: RE | Admit: 2016-09-28 | Discharge: 2016-09-28 | Disposition: A | Discharge status: Home / Self Care | Payer: BLUE CROSS/BLUE SHIELD | Source: Ambulatory Visit | Attending: Family Medicine | Admitting: Family Medicine

## 2016-09-28 ENCOUNTER — Other Ambulatory Visit: Payer: Self-pay | Admitting: Family Medicine

## 2016-09-28 DIAGNOSIS — R928 Other abnormal and inconclusive findings on diagnostic imaging of breast: Secondary | ICD-10-CM

## 2016-09-28 DIAGNOSIS — N6320 Unspecified lump in the left breast, unspecified quadrant: Secondary | ICD-10-CM

## 2016-10-11 ENCOUNTER — Ambulatory Visit
Admission: RE | Admit: 2016-10-11 | Discharge: 2016-10-11 | Disposition: A | Discharge status: Home / Self Care | Payer: 59 | Source: Ambulatory Visit | Attending: Family Medicine | Admitting: Family Medicine

## 2016-10-11 ENCOUNTER — Other Ambulatory Visit: Payer: Self-pay | Admitting: Family Medicine

## 2016-10-11 DIAGNOSIS — N6320 Unspecified lump in the left breast, unspecified quadrant: Secondary | ICD-10-CM

## 2016-11-21 ENCOUNTER — Encounter: Payer: Self-pay | Admitting: Family Medicine

## 2016-11-21 MED ORDER — CLOTRIMAZOLE-BETAMETHASONE 1-0.05 % EX CREA: 1.0000 "application " | TOPICAL_CREAM | Freq: Two times a day (BID) | CUTANEOUS | 1 refills | Status: DC

## 2017-02-04 DIAGNOSIS — H5203 Hypermetropia, bilateral: Secondary | ICD-10-CM | POA: Diagnosis not present

## 2017-02-04 DIAGNOSIS — H40001 Preglaucoma, unspecified, right eye: Secondary | ICD-10-CM | POA: Diagnosis not present

## 2017-02-08 DIAGNOSIS — Z760 Encounter for issue of repeat prescription: Secondary | ICD-10-CM | POA: Diagnosis not present

## 2017-02-08 DIAGNOSIS — N760 Acute vaginitis: Secondary | ICD-10-CM | POA: Diagnosis not present

## 2017-02-08 DIAGNOSIS — Z113 Encounter for screening for infections with a predominantly sexual mode of transmission: Secondary | ICD-10-CM | POA: Diagnosis not present

## 2017-03-07 ENCOUNTER — Ambulatory Visit (INDEPENDENT_AMBULATORY_CARE_PROVIDER_SITE_OTHER): Payer: 59 | Admitting: Family Medicine

## 2017-03-07 ENCOUNTER — Other Ambulatory Visit: Payer: Self-pay

## 2017-03-07 ENCOUNTER — Other Ambulatory Visit: Payer: Self-pay | Admitting: Family Medicine

## 2017-03-07 ENCOUNTER — Encounter: Payer: Self-pay | Admitting: Family Medicine

## 2017-03-07 ENCOUNTER — Ambulatory Visit (HOSPITAL_BASED_OUTPATIENT_CLINIC_OR_DEPARTMENT_OTHER)
Admission: RE | Admit: 2017-03-07 | Discharge: 2017-03-07 | Disposition: A | Discharge status: Home / Self Care | Payer: 59 | Source: Ambulatory Visit | Attending: Family Medicine | Admitting: Family Medicine

## 2017-03-07 VITALS — BP 122/78 | HR 82 | Temp 98.1°F | Resp 16 | Ht 67.0 in | Wt 231.0 lb

## 2017-03-07 DIAGNOSIS — M79661 Pain in right lower leg: Secondary | ICD-10-CM | POA: Insufficient documentation

## 2017-03-07 DIAGNOSIS — M7989 Other specified soft tissue disorders: Secondary | ICD-10-CM | POA: Insufficient documentation

## 2017-03-07 MED ORDER — MELOXICAM 15 MG PO TABS: 15.0000 mg | ORAL_TABLET | Freq: Every day | ORAL | 0 refills | Status: DC

## 2017-03-07 NOTE — Progress Notes (Signed)
   Subjective:    Patient ID: Shannon Anderson, female    DOB: December 04, 1985, 31 y.o.   MRN: [id number]  HPI Leg pain- R leg, sxs started 'a few weeks ago'.  Pain is described as 'a sharp, throbbing pain' that starts in the calf and radiates up into thigh.  Improves w/ 'moving it around'.  Wears compression socks at work.  Pt bought new sneakers recently.  No swelling, no redness.  Pt was using the NuvaRing a few months ago but not since pain started.   Review of Systems For ROS see HPI     Objective:   Physical Exam  Constitutional: She is oriented to person, place, and time. She appears well-developed and well-nourished. No distress.  obese  Cardiovascular: Normal rate, regular rhythm, normal heart sounds and intact distal pulses.  Pulmonary/Chest: Effort normal and breath sounds normal. No respiratory distress. She has no wheezes. She has no rales.  Musculoskeletal: She exhibits edema (mild R lower leg swelling) and tenderness (TTP over R posterior calf).  Neurological: She is alert and oriented to person, place, and time.  Skin: Skin is warm and dry. No erythema.  Vitals reviewed.         Assessment & Plan:  R calf pain- new.  Given pt's weight and the fact that she was recently on NuvaRing, concern is for DVT.  Get stat doppler to assess.  Will tx based on results- Xarelto for clot vs scheduled NSAIDs for musculoskeletal issue.  Reviewed supportive care and red flags that should prompt return.  Pt expressed understanding and is in agreement w/ plan.

## 2017-03-07 NOTE — Patient Instructions (Signed)
We will notify you of your ultrasound appt and determine the next steps HEAT! Continue to wear good supportive shoes Call with any questions or concerns Hang in there! Happy Holidays!

## 2017-03-15 ENCOUNTER — Encounter: Payer: Self-pay | Admitting: Family Medicine

## 2017-03-15 MED ORDER — NORETHINDRONE 0.35 MG PO TABS: 1.0000 | ORAL_TABLET | Freq: Every day | ORAL | 11 refills | Status: DC

## 2017-04-09 ENCOUNTER — Telehealth: Payer: Self-pay | Admitting: Family

## 2017-04-09 DIAGNOSIS — N390 Urinary tract infection, site not specified: Secondary | ICD-10-CM

## 2017-04-09 DIAGNOSIS — A499 Bacterial infection, unspecified: Secondary | ICD-10-CM

## 2017-04-09 MED ORDER — CEPHALEXIN 500 MG PO CAPS: 500.0000 mg | ORAL_CAPSULE | Freq: Three times a day (TID) | ORAL | 0 refills | Status: DC

## 2017-04-09 MED ORDER — FLUCONAZOLE 150 MG PO TABS: 150.0000 mg | ORAL_TABLET | Freq: Once | ORAL | 0 refills | Status: AC

## 2017-04-09 NOTE — Progress Notes (Signed)
We are sorry that you are not feeling well.  Here is how we plan to help!  Based on what you shared with me it looks like you most likely have a simple urinary tract infection.  A UTI (Urinary Tract Infection) is a bacterial infection of the bladder.  Most cases of urinary tract infections are simple to treat but a key part of your care is to encourage you to drink plenty of fluids and watch your symptoms carefully.  I have prescribed Keflex 500 mg twice a day for 7 days. I also sent in Diflucan for yeast infection.  Your symptoms should gradually improve. Call us if the burning in your urine worsens, you develop worsening fever, back pain or pelvic pain or if your symptoms do not resolve after completing the antibiotic.  Urinary tract infections can be prevented by drinking plenty of water to keep your body hydrated.  Also be sure when you wipe, wipe from front to back and don't hold it in!  If possible, empty your bladder every 4 hours.  Your e-visit answers were reviewed by a board certified advanced clinical practitioner to complete your personal care plan.  Depending on the condition, your plan could have included both over the counter or prescription medications.  If there is a problem please reply  once you have received a response from your provider.  Your safety is important to us.  If you have drug allergies check your prescription carefully.    You can use MyChart to ask questions about today's visit, request a non-urgent call back, or ask for a work or school excuse for 24 hours related to this e-Visit. If it has been greater than 24 hours you will need to follow up with your provider, or enter a new e-Visit to address those concerns.   You will get an e-mail in the next two days asking about your experience.  I hope that your e-visit has been valuable and will speed your recovery. Thank you for using e-visits.

## 2017-06-26 ENCOUNTER — Other Ambulatory Visit: Payer: Self-pay | Admitting: Family Medicine

## 2017-06-26 MED ORDER — RIZATRIPTAN BENZOATE 10 MG PO TABS: ORAL_TABLET | ORAL | 99 refills | Status: DC

## 2017-06-26 NOTE — Telephone Encounter (Signed)
Copied from CRM [phone]#[id number]. Topic: Quick Communication - Rx Refill/Question >> Jun 26, 2017  8:23 AM Eston Mouldavis, Quinlan Vollmer B wrote: Medication: rizatriptan (MAXALT) 10 MG tablet   Has the patient contacted their pharmacy? No refills  (Agent: If no, request that the patient contact the pharmacy for the refill.)  Preferred Pharmacy (with phone number or street name): [id number] - [place], [place] - [address] [facility] [address] [phone] ([phone] (Fax)     Agent: Please be advised that RX refills may take up to 3 business days. We ask that you follow-up with your pharmacy.

## 2017-06-26 NOTE — Telephone Encounter (Signed)
Rx refill request: rizatriptan 10 mg  LOV: 03/07/17, 09/03/16 ( not addressed)  PCP: Beverely Lowabori   Pharmacy : verified

## 2017-08-01 ENCOUNTER — Encounter (INDEPENDENT_AMBULATORY_CARE_PROVIDER_SITE_OTHER): Payer: Self-pay

## 2017-08-15 ENCOUNTER — Encounter (INDEPENDENT_AMBULATORY_CARE_PROVIDER_SITE_OTHER): Payer: Self-pay | Admitting: Family Medicine

## 2017-08-15 ENCOUNTER — Ambulatory Visit (INDEPENDENT_AMBULATORY_CARE_PROVIDER_SITE_OTHER): Payer: No Typology Code available for payment source | Admitting: Family Medicine

## 2017-08-15 ENCOUNTER — Encounter (INDEPENDENT_AMBULATORY_CARE_PROVIDER_SITE_OTHER): Payer: Self-pay

## 2017-08-15 DIAGNOSIS — Z0289 Encounter for other administrative examinations: Secondary | ICD-10-CM

## 2017-08-20 ENCOUNTER — Encounter (INDEPENDENT_AMBULATORY_CARE_PROVIDER_SITE_OTHER): Payer: Self-pay | Admitting: Family Medicine

## 2017-08-20 ENCOUNTER — Ambulatory Visit (INDEPENDENT_AMBULATORY_CARE_PROVIDER_SITE_OTHER): Payer: No Typology Code available for payment source | Admitting: Family Medicine

## 2017-08-20 VITALS — BP 112/73 | HR 71 | Temp 98.4°F | Ht 67.0 in | Wt 220.0 lb

## 2017-08-20 DIAGNOSIS — Z9189 Other specified personal risk factors, not elsewhere classified: Secondary | ICD-10-CM

## 2017-08-20 DIAGNOSIS — R5383 Other fatigue: Secondary | ICD-10-CM

## 2017-08-20 DIAGNOSIS — Z6834 Body mass index (BMI) 34.0-34.9, adult: Secondary | ICD-10-CM | POA: Diagnosis not present

## 2017-08-20 DIAGNOSIS — Z1331 Encounter for screening for depression: Secondary | ICD-10-CM | POA: Diagnosis not present

## 2017-08-20 DIAGNOSIS — E669 Obesity, unspecified: Secondary | ICD-10-CM

## 2017-08-20 DIAGNOSIS — G43009 Migraine without aura, not intractable, without status migrainosus: Secondary | ICD-10-CM

## 2017-08-20 DIAGNOSIS — R0602 Shortness of breath: Secondary | ICD-10-CM | POA: Diagnosis not present

## 2017-08-21 LAB — COMPREHENSIVE METABOLIC PANEL
A/G RATIO: 1.4 (ref 1.2–2.2)
ALBUMIN: 4 g/dL (ref 3.5–5.5)
ALT: 6 IU/L (ref 0–32)
AST: 12 IU/L (ref 0–40)
Alkaline Phosphatase: 39 IU/L (ref 39–117)
BILIRUBIN TOTAL: 0.3 mg/dL (ref 0.0–1.2)
BUN / CREAT RATIO: 7 — AB (ref 9–23)
BUN: 7 mg/dL (ref 6–20)
CHLORIDE: 101 mmol/L (ref 96–106)
CO2: 24 mmol/L (ref 20–29)
Calcium: 9.4 mg/dL (ref 8.7–10.2)
Creatinine, Ser: 0.95 mg/dL (ref 0.57–1.00)
GFR calc non Af Amer: 80 mL/min/{1.73_m2} (ref >59)
GFR, EST AFRICAN AMERICAN: 92 mL/min/{1.73_m2} (ref >59)
Globulin, Total: 2.8 g/dL (ref 1.5–4.5)
Glucose: 83 mg/dL (ref 65–99)
POTASSIUM: 4.3 mmol/L (ref 3.5–5.2)
Sodium: 137 mmol/L (ref 134–144)
TOTAL PROTEIN: 6.8 g/dL (ref 6.0–8.5)

## 2017-08-21 LAB — LIPID PANEL WITH LDL/HDL RATIO
Cholesterol, Total: 172 mg/dL (ref 100–199)
HDL: 52 mg/dL (ref >39)
LDL CALC: 103 mg/dL — AB (ref 0–99)
LDL/HDL RATIO: 2 ratio (ref 0.0–3.2)
Triglycerides: 83 mg/dL (ref 0–149)
VLDL CHOLESTEROL CAL: 17 mg/dL (ref 5–40)

## 2017-08-21 LAB — CBC WITH DIFFERENTIAL
BASOS ABS: 0 10*3/uL (ref 0.0–0.2)
Basos: 0 %
EOS (ABSOLUTE): 0.1 10*3/uL (ref 0.0–0.4)
Eos: 1 %
Hematocrit: 36.9 % (ref 34.0–46.6)
Hemoglobin: 12 g/dL (ref 11.1–15.9)
Immature Grans (Abs): 0 10*3/uL (ref 0.0–0.1)
Immature Granulocytes: 0 %
LYMPHS ABS: 3.5 10*3/uL — AB (ref 0.7–3.1)
Lymphs: 47 %
MCH: 27.2 pg (ref 26.6–33.0)
MCHC: 32.5 g/dL (ref 31.5–35.7)
MCV: 84 fL (ref 79–97)
MONOS ABS: 0.5 10*3/uL (ref 0.1–0.9)
Monocytes: 7 %
NEUTROS PCT: 45 %
Neutrophils Absolute: 3.5 10*3/uL (ref 1.4–7.0)
RBC: 4.41 x10E6/uL (ref 3.77–5.28)
RDW: 14.2 % (ref 12.3–15.4)
WBC: 7.6 10*3/uL (ref 3.4–10.8)

## 2017-08-21 LAB — T4, FREE: Free T4: 1.15 ng/dL (ref 0.82–1.77)

## 2017-08-21 LAB — TSH: TSH: 1.34 u[IU]/mL (ref 0.450–4.500)

## 2017-08-21 LAB — HEMOGLOBIN A1C
Est. average glucose Bld gHb Est-mCnc: 114 mg/dL
Hgb A1c MFr Bld: 5.6 % (ref 4.8–5.6)

## 2017-08-21 LAB — INSULIN, RANDOM: INSULIN: 12.2 u[IU]/mL (ref 2.6–24.9)

## 2017-08-21 LAB — FOLATE: Folate: 18.8 ng/mL (ref >3.0)

## 2017-08-21 LAB — VITAMIN B12: VITAMIN B 12: 349 pg/mL (ref 232–1245)

## 2017-08-21 LAB — VITAMIN D 25 HYDROXY (VIT D DEFICIENCY, FRACTURES): VIT D 25 HYDROXY: 44.3 ng/mL (ref 30.0–100.0)

## 2017-08-21 LAB — T3: T3, Total: 186 ng/dL — ABNORMAL HIGH (ref 71–180)

## 2017-08-21 NOTE — Progress Notes (Signed)
Office: [phone]  /  Fax: [phone]   Dear Dr. Beverely Anderson,   Thank you for referring Shannon Anderson to our clinic. The following note includes my evaluation and treatment recommendations.  HPI:   Chief Complaint: OBESITY    Shannon Anderson has been referred by Shannon Anderson. Shannon Low, MD for consultation regarding her obesity and obesity related comorbidities.    Shannon Anderson (MR# [id number]) is a 32 y.o. female who presents on 08/21/2017 for obesity evaluation and treatment. Current BMI is Body mass index is 34.46 kg/m.Marland Kitchen Shannon Anderson has been struggling with her weight for many years and has been unsuccessful in either losing weight, maintaining weight loss, or reaching her healthy weight goal.     Shannon Anderson attended our information session and states she is currently in the action stage of change and ready to dedicate time achieving and maintaining a healthier weight. Shannon Anderson is interested in becoming our patient and working on intensive lifestyle modifications including (but not limited to) diet, exercise and weight loss. Shannon Anderson is vegan, but she does eat fish.    Shannon Anderson states her family eats meals together she thinks her family will eat healthier with  her she struggles with family and or coworkers weight loss sabotage her desired weight is 170 lbs she has been heavy most of  her life her heaviest weight ever was 233 lbs. she is a picky eater and doesn't like to eat healthier foods  she has significant food cravings issues  she skips meals frequently she is trying to eat vegan she is frequently drinking liquids with calories she frequently makes poor food choices she struggles with emotional eating    Fatigue Shannon Anderson feels her energy is lower than it should be. This has worsened with weight gain and has not worsened recently. Shannon Anderson admits to daytime somnolence and  denies waking up still tired. Patient is at risk for obstructive sleep apnea. Patent has a history of  symptoms of daytime fatigue. Patient generally gets 7 or 8 hours of sleep per night, and states they generally have  restful sleep. Snoring is present. Apneic episodes are not present. Epworth Sleepiness Score is 6  EKG was ordered today and shows normal sinus rhythm.  Dyspnea on exertion [name] notes increasing shortness of breath with exercising and seems to be worsening over time with weight gain. She notes getting out of breath sooner with activity than she used to. This has not gotten worse recently. EKG was ordered today and shows normal sinus rhythm. Shannon Anderson denies orthopnea.  Migraine Headaches Shannon Anderson has a diagnosis of migraine headaches and she has one migraine per month.  Depression Screen Shannon Anderson's Food and Mood (modified PHQ-9) score was  Depression screen PHQ 2/9 08/20/2017  Decreased Interest 1  Down, Depressed, Hopeless 2  PHQ - 2 Score 3  Altered sleeping 2  Tired, decreased energy 3  Change in appetite 2  Feeling bad or failure about yourself  2  Trouble concentrating 0  Moving slowly or fidgety/restless 0  Suicidal thoughts 0  PHQ-9 Score 12  Difficult doing work/chores Somewhat difficult    ALLERGIES: Allergies  Allergen Reactions  . Monistat 1 [Tioconazole] Swelling  . Other Other (See Comments)    Artificial sweetners - migraines MSG - migraines    MEDICATIONS: Current Outpatient Medications on File Prior to Visit  Medication Sig Dispense Refill  . etonogestrel-ethinyl estradiol (NUVARING) 0.12-0.015 MG/24HR vaginal ring Place 1 each vaginally every 28 (twenty-eight) days. Insert vaginally and leave in place for  3 consecutive weeks, then remove for 1 week.    . Multiple Vitamin (MULTIVITAMIN) capsule Take 1 capsule by mouth daily.    . rizatriptan (MAXALT) 10 MG tablet TAKE 1 TABLET ( ) BY MOUTH AS NEEDED FOR MIGRAINE MAY REPEAT IN 2 HOURS IF NEEDED 10 tablet PRN   No current facility-administered medications on file prior to visit.     PAST  MEDICAL HISTORY: Past Medical History:  Diagnosis Date  . Depression   . Migraine   . Vitamin D deficiency     PAST SURGICAL HISTORY: History reviewed. No pertinent surgical history.  SOCIAL HISTORY: Social History   Tobacco Use  . Smoking status: Never Smoker  . Smokeless tobacco: Never Used  Substance Use Topics  . Alcohol use: Yes    Comment: rarely  . Drug use: Not on file    FAMILY HISTORY: Family History  Problem Relation Age of Onset  . Hypertension Mother   . Cancer Mother 75       breast  . Breast cancer Mother   . Depression Mother   . Hypertension Father   . Diabetes Sister   . Cancer Maternal Aunt   . Breast cancer Maternal Aunt   . Stroke Maternal Grandmother   . Diabetes Paternal Grandmother   . Breast cancer Cousin     ROS: Review of Systems  Constitutional: Positive for malaise/fatigue.  Eyes:       Wear Glasses or Contacts  Respiratory: Positive for shortness of breath (on exertion).   Cardiovascular: Negative for orthopnea.  Neurological: Positive for headaches.    PHYSICAL EXAM: Blood pressure 112/73, pulse 71, temperature 98.4 F (36.9 C), temperature source Oral, height  (1.702 m), weight 220 lb (99.8 kg), last menstrual period 07/27/2017, SpO2 100 %. Body mass index is 34.46 kg/m. Physical Exam  Constitutional: She is oriented to person, place, and time. She appears well-developed and well-nourished.  HENT:  Head: Normocephalic and atraumatic.  Nose: Nose normal.  Eyes: EOM are normal. No scleral icterus.  Neck: Normal range of motion. Neck supple. No thyromegaly present.  Cardiovascular: Normal rate and regular rhythm.  Pulmonary/Chest: Effort normal. No respiratory distress.  Abdominal: Soft. There is no tenderness.  + obesity  Musculoskeletal: Normal range of motion.  Range of Motion normal in all 4 extremities  Neurological: She is alert and oriented to person, place, and time. Coordination normal.  Skin: Skin is warm  and dry.  Psychiatric: She has a normal mood and affect. Her behavior is normal.  Vitals reviewed.   RECENT LABS AND TESTS: BMET    Component Value Date/Time   NA 137 08/20/2017 1303   K 4.3 08/20/2017 1303   CL 101 08/20/2017 1303   CO2 24 08/20/2017 1303   GLUCOSE 83 08/20/2017 1303   GLUCOSE 95 09/03/2016 0950   BUN 7 08/20/2017 1303   CREATININE 0.95 08/20/2017 1303   CALCIUM 9.4 08/20/2017 1303   GFRNONAA 80 08/20/2017 1303   GFRAA 92 08/20/2017 1303   Lab Results  Component Value Date   HGBA1C 5.6 08/20/2017   Lab Results  Component Value Date   INSULIN 12.2 08/20/2017   CBC    Component Value Date/Time   WBC 7.6 08/20/2017 1303   WBC 7.9 09/03/2016 0950   RBC 4.41 08/20/2017 1303   RBC 4.37 09/03/2016 0950   HGB 12.0 08/20/2017 1303   HCT 36.9 08/20/2017 1303   PLT 327.0 09/03/2016 0950   MCV 84 08/20/2017 1303   MCH  27.2 08/20/2017 1303   MCHC 32.5 08/20/2017 1303   MCHC 33.0 09/03/2016 0950   RDW 14.2 08/20/2017 1303   LYMPHSABS 3.5 (H) 08/20/2017 1303   MONOABS 0.5 09/03/2016 0950   EOSABS 0.1 08/20/2017 1303   BASOSABS 0.0 08/20/2017 1303   Iron/TIBC/Ferritin/ %Sat No results found for: IRON, TIBC, FERRITIN, IRONPCTSAT Lipid Panel     Component Value Date/Time   CHOL 172 08/20/2017 1303   TRIG 83 08/20/2017 1303   HDL 52 08/20/2017 1303   CHOLHDL 4 09/03/2016 0950   VLDL 14.8 09/03/2016 0950   LDLCALC 103 (H) 08/20/2017 1303   Hepatic Function Panel     Component Value Date/Time   PROT 6.8 08/20/2017 1303   ALBUMIN 4.0 08/20/2017 1303   AST 12 08/20/2017 1303   ALT 6 08/20/2017 1303   ALKPHOS 39 08/20/2017 1303   BILITOT 0.3 08/20/2017 1303   BILIDIR 0.1 09/03/2016 0950      Component Value Date/Time   TSH 1.340 08/20/2017 1303   TSH 1.64 09/03/2016 0950   TSH 1.34 08/16/2015 1003    ECG  shows NSR with a rate of 77 BPM INDIRECT CALORIMETER done today shows a VO2 of 295 and a REE of 2055.  Her calculated basal metabolic rate  is 1797 thus her basal metabolic rate is better than expected.    ASSESSMENT AND PLAN: Other fatigue - Plan: EKG 12-Lead, CBC With Differential, Comprehensive metabolic panel, Hemoglobin A1c, Insulin, random, Lipid Panel With LDL/HDL Ratio, VITAMIN D 25 Hydroxy (Vit-D Deficiency, Fractures), Vitamin B12, Folate, T3, T4, free, TSH  Shortness of breath on exertion  Migraine without aura and without status migrainosus, not intractable  Depression screening  At risk for osteoporosis  Class 1 obesity with serious comorbidity and body mass index (BMI) of 34.0 to 34.9 in adult, unspecified obesity type  PLAN: Fatigue Erie was informed that her fatigue may be related to obesity, depression or many other causes. Labs will be ordered, and in the meanwhile Moana has agreed to work on diet, exercise and weight loss to help with fatigue. Proper sleep hygiene was discussed including the need for 7-8 hours of quality sleep each night. A sleep study was not ordered based on symptoms and Epworth score.  Dyspnea on exertion Dejai's shortness of breath appears to be obesity related and exercise induced. She has agreed to work on weight loss and gradually increase exercise to treat her exercise induced shortness of breath. If Teisha follows our instructions and loses weight without improvement of her shortness of breath, we will plan to refer to pulmonology. We will monitor this condition regularly. Eron agrees to this plan.  Migraine Headaches We will continue to follow Gerlean and she has agreed to follow up with our clinic at the agreed upon time.  Depression Screen Adalee had a moderately positive depression screening. Depression is commonly associated with obesity and often results in emotional eating behaviors. We will monitor this closely and work on CBT to help improve the non-hunger eating patterns. Referral to Psychology may be required if no improvement is seen as she continues in our  clinic.  Obesity Verlie is currently in the action stage of change and her goal is to continue with weight loss efforts. I recommend Rosealie begin the structured treatment plan as follows:  She has agreed to keep a food journal with 1300 to 1500 calories and 80++ grams of protein daily with modifications. Patient was given the vegetarian and pescatarian plan to use as a  guide and also gave her protein content handout with the vegetable products. Jolan has been instructed to eventually work up to a goal of 150 minutes of combined cardio and strengthening exercise per week for weight loss and overall health benefits. We discussed the following Behavioral Modification Strategies today: planning for success, increasing lean protein intake, decreasing simple carbohydrates , increasing vegetables, decrease eating out and work on meal planning and easy cooking plans   She was informed of the importance of frequent follow up visits to maximize her success with intensive lifestyle modifications for her multiple health conditions. She was informed we would discuss her lab results at her next visit unless there is a critical issue that needs to be addressed sooner. Khaleelah agreed to keep her next visit at the agreed upon time to discuss these results.    OBESITY BEHAVIORAL INTERVENTION VISIT  Today's visit was # 1 out of 22.  Starting weight: 220 lbs Starting date: 08/20/17 Today's weight : 220 lbs  Today's date: 08/20/2017 Total lbs lost to date: 0 (Patients must lose 7 lbs in the first 6 months to continue with counseling)   ASK: We discussed the diagnosis of obesity with Lattie Haw today and Teresina agreed to give [place] permission to discuss obesity behavioral modification therapy today.  ASSESS: Teiana has the diagnosis of obesity and her BMI today is 34.45 Lugenia is in the action stage of change   ADVISE: Soumya was educated on the multiple health risks of obesity as well as the  benefit of weight loss to improve her health. She was advised of the need for long term treatment and the importance of lifestyle modifications.  AGREE: Multiple dietary modification options and treatment options were discussed and  Ralynn agreed to the above obesity treatment plan.   I, Nevada Crane, am acting as transcriptionist for  Filbert Schilder, MD   I have reviewed the above documentation for accuracy and completeness, and I agree with the above. - Debbra Riding, MD

## 2017-09-03 ENCOUNTER — Ambulatory Visit (INDEPENDENT_AMBULATORY_CARE_PROVIDER_SITE_OTHER): Payer: No Typology Code available for payment source | Admitting: Family Medicine

## 2017-09-03 VITALS — BP 118/82 | HR 68 | Temp 98.6°F | Ht 67.0 in | Wt 216.0 lb

## 2017-09-03 DIAGNOSIS — E7800 Pure hypercholesterolemia, unspecified: Secondary | ICD-10-CM

## 2017-09-03 DIAGNOSIS — E669 Obesity, unspecified: Secondary | ICD-10-CM

## 2017-09-03 DIAGNOSIS — Z6834 Body mass index (BMI) 34.0-34.9, adult: Secondary | ICD-10-CM

## 2017-09-03 DIAGNOSIS — E8881 Metabolic syndrome: Secondary | ICD-10-CM

## 2017-09-03 DIAGNOSIS — E559 Vitamin D deficiency, unspecified: Secondary | ICD-10-CM | POA: Diagnosis not present

## 2017-09-03 NOTE — Progress Notes (Signed)
Office: [phone]  /  Fax: [phone]   HPI:   Chief Complaint: OBESITY Shannon Anderson is here to discuss her progress with her obesity treatment plan. She is on the keep a food journal with 1300 to 1500 calories and 80+ grams of protein daily and is following her eating plan approximately 60 to 70 % of the time. She states she is exercising 0 minutes 0 times per week. Shannon Anderson ate out 3 to 4 days. She is writing down food, instead of using [employer]. She has been getting at least 70 grams of protein daily. Her weight is 216 lb (98 kg) today and has had a weight loss of 4 pounds over a period of 2 weeks since her last visit. She has lost 4 lbs since starting treatment with us.  Vitamin D deficiency Shannon Anderson has a diagnosis of vitamin D deficiency. She was previously on multi vitamin with vit D 2,000 IU daily. Shannon Anderson denies nausea, vomiting or muscle weakness.  Insulin Resistance Shannon Anderson has a diagnosis of insulin resistance based on her elevated fasting insulin level of 12.2 and Hgb A1c of 5.6. Although Shannon Anderson's blood glucose readings are still under good control, insulin resistance puts her at greater risk of metabolic syndrome and diabetes. She is not taking metformin currently and continues to work on diet and exercise to decrease risk of diabetes.  ALLERGIES: Allergies  Allergen Reactions  . Monistat 1 [Tioconazole] Swelling  . Other Other (See Comments)    Artificial sweetners - migraines MSG - migraines    MEDICATIONS: Current Outpatient Medications on File Prior to Visit  Medication Sig Dispense Refill  . etonogestrel-ethinyl estradiol (NUVARING) 0.12-0.015 MG/24HR vaginal ring Place 1 each vaginally every 28 (twenty-eight) days. Insert vaginally and leave in place for 3 consecutive weeks, then remove for 1 week.    . Multiple Vitamin (MULTIVITAMIN) capsule Take 1 capsule by mouth daily.    . rizatriptan (MAXALT) 10 MG tablet TAKE 1 TABLET (10MG ) BY MOUTH AS NEEDED FOR  MIGRAINE MAY REPEAT IN 2 HOURS IF NEEDED 10 tablet PRN   No current facility-administered medications on file prior to visit.     PAST MEDICAL HISTORY: Past Medical History:  Diagnosis Date  . Depression   . Migraine   . Vitamin D deficiency     PAST SURGICAL HISTORY: No past surgical history on file.  SOCIAL HISTORY: Social History   Tobacco Use  . Smoking status: Never Smoker  . Smokeless tobacco: Never Used  Substance Use Topics  . Alcohol use: Yes    Comment: rarely  . Drug use: Not on file    FAMILY HISTORY: Family History  Problem Relation Age of Onset  . Hypertension Mother   . Cancer Mother [id number]       breast  . Breast cancer Mother   . Depression Mother   . Hypertension Father   . Diabetes Sister   . Cancer Maternal Aunt   . Breast cancer Maternal Aunt   . Stroke Maternal Grandmother   . Diabetes Paternal Grandmother   . Breast cancer Cousin     ROS: Review of Systems  Constitutional: Positive for weight loss.  Gastrointestinal: Negative for nausea and vomiting.  Musculoskeletal:       Negative for muscle weakness    PHYSICAL EXAM: Blood pressure 118/82, pulse 68, temperature 98.6 F (37 C), temperature source Oral, height 5\' 7"  (1.702 m), weight 216 lb (98 kg), SpO2 99 %. Body mass index is 33.83 kg/m. Physical Exam  Constitutional:  She is oriented to person, place, and time. She appears well-developed and well-nourished.  Cardiovascular: Normal rate.  Pulmonary/Chest: Effort normal.  Musculoskeletal: Normal range of motion.  Neurological: She is oriented to person, place, and time.  Skin: Skin is warm and dry.  Psychiatric: She has a normal mood and affect. Her behavior is normal.  Vitals reviewed.   RECENT LABS AND TESTS: BMET    Component Value Date/Time   NA 137 08/20/2017 1303   K 4.3 08/20/2017 1303   CL 101 08/20/2017 1303   CO2 24 08/20/2017 1303   GLUCOSE 83 08/20/2017 1303   GLUCOSE 95 09/03/2016 0950   BUN 7 08/20/2017  1303   CREATININE 0.95 08/20/2017 1303   CALCIUM 9.4 08/20/2017 1303   GFRNONAA 80 08/20/2017 1303   GFRAA 92 08/20/2017 1303   Lab Results  Component Value Date   HGBA1C 5.6 08/20/2017   Lab Results  Component Value Date   INSULIN 12.2 08/20/2017   CBC    Component Value Date/Time   WBC 7.6 08/20/2017 1303   WBC 7.9 09/03/2016 0950   RBC 4.41 08/20/2017 1303   RBC 4.37 09/03/2016 0950   HGB 12.0 08/20/2017 1303   HCT 36.9 08/20/2017 1303   PLT 327.0 09/03/2016 0950   MCV 84 08/20/2017 1303   MCH 27.2 08/20/2017 1303   MCHC 32.5 08/20/2017 1303   MCHC 33.0 09/03/2016 0950   RDW 14.2 08/20/2017 1303   LYMPHSABS 3.5 (H) 08/20/2017 1303   MONOABS 0.5 09/03/2016 0950   EOSABS 0.1 08/20/2017 1303   BASOSABS 0.0 08/20/2017 1303   Iron/TIBC/Ferritin/ %Sat No results found for: IRON, TIBC, FERRITIN, IRONPCTSAT Lipid Panel     Component Value Date/Time   CHOL 172 08/20/2017 1303   TRIG 83 08/20/2017 1303   HDL 52 08/20/2017 1303   CHOLHDL 4 09/03/2016 0950   VLDL 14.8 09/03/2016 0950   LDLCALC 103 (H) 08/20/2017 1303   Hepatic Function Panel     Component Value Date/Time   PROT 6.8 08/20/2017 1303   ALBUMIN 4.0 08/20/2017 1303   AST 12 08/20/2017 1303   ALT 6 08/20/2017 1303   ALKPHOS 39 08/20/2017 1303   BILITOT 0.3 08/20/2017 1303   BILIDIR 0.1 09/03/2016 0950      Component Value Date/Time   TSH 1.340 08/20/2017 1303   TSH 1.64 09/03/2016 0950   TSH 1.34 08/16/2015 1003   Results for Shannon Anderson (MRN [id number]) as of 09/03/2017 15:19  Ref. Range 08/20/2017 13:03  Vitamin D, 25-Hydroxy Latest Ref Range: 30.0 - 100.0 ng/mL 44.3   ASSESSMENT AND PLAN: Vitamin D deficiency  Insulin resistance  Elevated LDL cholesterol level  Class 1 obesity with serious comorbidity and body mass index (BMI) of 34.0 to 34.9 in adult, unspecified obesity type  PLAN:  Vitamin D Deficiency Shannon Anderson was informed that low vitamin D levels contributes to fatigue and  are associated with obesity, breast, and colon cancer. She agrees to restart OTC multi vitamin with Vit D 2,000 IU daily and will follow up for routine testing of vitamin D, at least 2-3 times per year. She was informed of the risk of over-replacement of vitamin D and agrees to not increase her dose unless she discusses this with [place] first. Shannon Anderson agrees to follow up with our clinic in 2 weeks.  Insulin Resistance Shannon Anderson will continue to work on weight loss, exercise, and decreasing simple carbohydrates in her diet to help decrease the risk of diabetes. She was informed that eating  too many simple carbohydrates or too many calories at one sitting increases the likelihood of GI side effects. Shannon Anderson agrees to continue journaling; patient to increase awareness of protein intake. Shannon Anderson agreed to follow up with [place] as directed to monitor her progress.  We spent > than 50% of the 15 minute visit on the counseling as documented in the note.  Obesity Shannon Anderson is currently in the action stage of change. As such, her goal is to continue with weight loss efforts She has agreed to keep a food journal with 1300 to 1500 calories and 80 grams of protein daily Shannon Anderson has been instructed to work up to a goal of 150 minutes of combined cardio and strengthening exercise per week for weight loss and overall health benefits. We discussed the following Behavioral Modification Strategies today: better snacking choices, keep a strict food journal, increasing lean protein intake, increasing vegetables and work on meal planning and easy cooking plans  Shannon Anderson has agreed to follow up with our clinic in 2 weeks. She was informed of the importance of frequent follow up visits to maximize her success with intensive lifestyle modifications for her multiple health conditions.   OBESITY BEHAVIORAL INTERVENTION VISIT  Today's visit was # 2 out of 22.  Starting weight: 220 lbs Starting date: 08/20/17 Today's weight : 216 lbs    Today's date: 09/03/2017 Total lbs lost to date: 4 (Patients must lose 7 lbs in the first 6 months to continue with counseling)   ASK: We discussed the diagnosis of obesity with Shannon Anderson today and Shannon Anderson agreed to give [place] permission to discuss obesity behavioral modification therapy today.  ASSESS: Shannon Anderson has the diagnosis of obesity and her BMI today is 33.82 Shannon Anderson is in the action stage of change   ADVISE: Shannon Anderson was educated on the multiple health risks of obesity as well as the benefit of weight loss to improve her health. She was advised of the need for long term treatment and the importance of lifestyle modifications.  AGREE: Multiple dietary modification options and treatment options were discussed and  Shannon Anderson agreed to the above obesity treatment plan.  I, [place] Crane, am acting as transcriptionist for Filbert Schilder, MD  I have reviewed the above documentation for accuracy and completeness, and I agree with the above. - Debbra Riding, MD

## 2017-09-04 ENCOUNTER — Encounter: Payer: Self-pay | Admitting: Family Medicine

## 2017-09-04 ENCOUNTER — Ambulatory Visit (INDEPENDENT_AMBULATORY_CARE_PROVIDER_SITE_OTHER): Payer: No Typology Code available for payment source | Admitting: Family Medicine

## 2017-09-04 ENCOUNTER — Telehealth: Payer: Self-pay | Admitting: Family Medicine

## 2017-09-04 ENCOUNTER — Other Ambulatory Visit: Payer: Self-pay

## 2017-09-04 VITALS — BP 114/74 | HR 71 | Temp 98.5°F | Resp 16 | Ht 67.0 in | Wt 222.0 lb

## 2017-09-04 DIAGNOSIS — E669 Obesity, unspecified: Secondary | ICD-10-CM

## 2017-09-04 DIAGNOSIS — Z6834 Body mass index (BMI) 34.0-34.9, adult: Principal | ICD-10-CM

## 2017-09-04 DIAGNOSIS — G4700 Insomnia, unspecified: Secondary | ICD-10-CM

## 2017-09-04 DIAGNOSIS — Z0000 Encounter for general adult medical examination without abnormal findings: Secondary | ICD-10-CM

## 2017-09-04 DIAGNOSIS — E010 Iodine-deficiency related diffuse (endemic) goiter: Secondary | ICD-10-CM

## 2017-09-04 MED ORDER — TEMAZEPAM 7.5 MG PO CAPS: 7.5000 mg | ORAL_CAPSULE | Freq: Every evening | ORAL | 1 refills | Status: DC | PRN

## 2017-09-04 NOTE — Progress Notes (Signed)
   Subjective:    Patient ID: Shannon Anderson, female    DOB: May 30, 1985, 32 y.o.   MRN: [id number]  HPI CPE- UTD on GYN, Tdap, flu.   Review of Systems Patient reports no vision/ hearing changes, adenopathy,fever, weight change,  persistant/recurrent hoarseness , swallowing issues, chest pain, palpitations, edema, persistant/recurrent cough, hemoptysis, dyspnea (rest/exertional/paroxysmal nocturnal), gastrointestinal bleeding (melena, rectal bleeding), abdominal pain, significant heartburn, bowel changes, GU symptoms (dysuria, hematuria, incontinence), Gyn symptoms (abnormal  bleeding, pain),  syncope, focal weakness, memory loss, numbness & tingling, skin/hair/nail changes, abnormal bruising or bleeding, anxiety, or depression.   + insomnia- pt works nights, has difficult time sleeping during the day.  Trazodone was ineffective.    Objective:   Physical Exam General Appearance:    Alert, cooperative, no distress, appears stated age  Head:    Normocephalic, without obvious abnormality, atraumatic  Eyes:    PERRL, conjunctiva/corneas clear, EOM's intact, fundi    benign, both eyes  Ears:    Normal TM's and external ear canals, both ears  Nose:   Nares normal, septum midline, mucosa normal, no drainage    or sinus tenderness  Throat:   Lips, mucosa, and tongue normal; teeth and gums normal  Neck:   Supple, symmetrical, trachea midline, no adenopathy;    Thyroid: diffusely enlarged thyroid  Back:     Symmetric, no curvature, ROM normal, no CVA tenderness  Lungs:     Clear to auscultation bilaterally, respirations unlabored  Chest Wall:    No tenderness or deformity   Heart:    Regular rate and rhythm, S1 and S2 normal, no murmur, rub   or gallop  Breast Exam:    Deferred to GYN  Abdomen:     Soft, non-tender, bowel sounds active all four quadrants,    no masses, no organomegaly  Genitalia:    Deferred to GYN  Rectal:    Extremities:   Extremities normal, atraumatic, no cyanosis or  edema  Pulses:   2+ and symmetric all extremities  Skin:   Skin color, texture, turgor normal, no rashes or lesions  Lymph nodes:   Cervical, supraclavicular, and axillary nodes normal  Neurologic:   CNII-XII intact, normal strength, sensation and reflexes    throughout          Assessment & Plan:

## 2017-09-04 NOTE — Patient Instructions (Signed)
Follow up in 1 year or as needed Your labs look great!  No need to repeat! We'll call you with your thyroid US appt START the Temazepam as needed/nightly for sleep If no improvement in sleep, we could switch gears and try Provigil- let me know Call [facility] [phone] to schedule an appt Call with any questions or concerns Have a great summer!!

## 2017-09-04 NOTE — Assessment & Plan Note (Signed)
Pt has hx of this.  Last US 2016.  Repeat to assess for growth or change.

## 2017-09-04 NOTE — Assessment & Plan Note (Signed)
Pt's PE WNL w/ exception of obesity and thyromegaly.  UTD on GYN.  Reviewed recent labs.  No changes at this time.  Anticipatory guidance provided.

## 2017-09-04 NOTE — Assessment & Plan Note (Signed)
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  Trazodone was ineffective.  Start Temazepam.  Melatonin is not an option given her fluctuating schedule.  Will follow.

## 2017-09-04 NOTE — Telephone Encounter (Signed)
Referral placed.

## 2017-09-04 NOTE — Telephone Encounter (Signed)
Pt states that she forgot to ask for a referral to [facility] to see Dr. Rinaldo RatelKadolph on [address] due to having [employer] insurance plan.

## 2017-09-04 NOTE — Telephone Encounter (Signed)
Ok for referral?

## 2017-09-17 ENCOUNTER — Encounter (INDEPENDENT_AMBULATORY_CARE_PROVIDER_SITE_OTHER): Payer: Self-pay

## 2017-09-18 ENCOUNTER — Ambulatory Visit (INDEPENDENT_AMBULATORY_CARE_PROVIDER_SITE_OTHER): Payer: No Typology Code available for payment source | Admitting: Family Medicine

## 2017-09-18 ENCOUNTER — Encounter (INDEPENDENT_AMBULATORY_CARE_PROVIDER_SITE_OTHER): Payer: Self-pay

## 2017-09-19 ENCOUNTER — Ambulatory Visit (INDEPENDENT_AMBULATORY_CARE_PROVIDER_SITE_OTHER): Payer: No Typology Code available for payment source | Admitting: Family Medicine

## 2017-09-19 VITALS — BP 106/71 | HR 71 | Temp 98.2°F | Ht 67.0 in | Wt 213.0 lb

## 2017-09-19 DIAGNOSIS — R7303 Prediabetes: Secondary | ICD-10-CM

## 2017-09-19 DIAGNOSIS — Z6833 Body mass index (BMI) 33.0-33.9, adult: Secondary | ICD-10-CM

## 2017-09-19 DIAGNOSIS — E669 Obesity, unspecified: Secondary | ICD-10-CM | POA: Diagnosis not present

## 2017-09-19 NOTE — Progress Notes (Signed)
Office: [phone]  /  Fax: [phone]   HPI:   Chief Complaint: OBESITY Shannon Anderson is here to discuss her progress with her obesity treatment plan. She is on the keep a food journal with 1300 to 1500 calories and 80 grams of protein daily and is following her eating plan approximately 50 % of the time. She states she is doing cardio for 30 minutes 1 to 2 times per week. Shannon Anderson continues to do well with weight loss, but she isn't always taking her food, and she is not meeting her protein goals most days. Her weight is 213 lb (96.6 kg) today and has had a weight loss of 3 pounds over a period of 2 weeks since her last visit. She has lost 4 lbs since starting treatment with [place].  Pre-Diabetes Shannon Anderson has a diagnosis of prediabetes based on her elevated Hgb A1c and was informed this puts her at greater risk of developing diabetes. Shannon Anderson is not taking metformin currently and she continues to work on diet and exercise to decrease risk of diabetes. Polyphagia is still present, especially in the evenings. She denies nausea or hypoglycemia.  ALLERGIES: Allergies  Allergen Reactions  . Monistat 1 [Tioconazole] Swelling  . Other Other (See Comments)    Artificial sweetners - migraines MSG - migraines    MEDICATIONS: Current Outpatient Medications on File Prior to Visit  Medication Sig Dispense Refill  . etonogestrel-ethinyl estradiol (NUVARING) 0.12-0.015 MG/24HR vaginal ring Place 1 each vaginally every 28 (twenty-eight) days. Insert vaginally and leave in place for 3 consecutive weeks, then remove for 1 week.    . Multiple Vitamin (MULTIVITAMIN) capsule Take 1 capsule by mouth daily.    . rizatriptan (MAXALT) 10 MG tablet TAKE 1 TABLET (10MG ) BY MOUTH AS NEEDED FOR MIGRAINE MAY REPEAT IN 2 HOURS IF NEEDED 10 tablet PRN  . temazepam (RESTORIL) 7.5 MG capsule Take 1 capsule (7.5 mg total) by mouth at bedtime as needed for sleep. 30 capsule 1   No current facility-administered medications  on file prior to visit.     PAST MEDICAL HISTORY: Past Medical History:  Diagnosis Date  . Depression   . Migraine   . Vitamin D deficiency     PAST SURGICAL HISTORY: No past surgical history on file.  SOCIAL HISTORY: Social History   Tobacco Use  . Smoking status: Never Smoker  . Smokeless tobacco: Never Used  Substance Use Topics  . Alcohol use: Yes    Comment: rarely  . Drug use: Not on file    FAMILY HISTORY: Family History  Problem Relation Age of Onset  . Hypertension Mother   . Cancer Mother 67       breast  . Breast cancer Mother   . Depression Mother   . Hypertension Father   . Diabetes Sister   . Cancer Maternal Aunt   . Breast cancer Maternal Aunt   . Stroke Maternal Grandmother   . Diabetes Paternal Grandmother   . Breast cancer Cousin     ROS: Review of Systems  Constitutional: Positive for weight loss.  Gastrointestinal: Negative for nausea.  Endo/Heme/Allergies:       Positive for polyphagia Negative for hypoglycemia    PHYSICAL EXAM: Blood pressure 106/71, pulse 71, temperature 98.2 F (36.8 C), temperature source Oral, height 5\' 7"  (1.702 m), weight 213 lb (96.6 kg), last menstrual period 08/31/2017, SpO2 99 %. Body mass index is 33.36 kg/m. Physical Exam  Constitutional: She is oriented to person, place, and time. She  appears well-developed and well-nourished.  Cardiovascular: Normal rate.  Pulmonary/Chest: Effort normal.  Musculoskeletal: Normal range of motion.  Neurological: She is oriented to person, place, and time.  Skin: Skin is warm and dry.  Psychiatric: She has a normal mood and affect. Her behavior is normal.  Vitals reviewed.   RECENT LABS AND TESTS: BMET    Component Value Date/Time   NA 137 08/20/2017 1303   K 4.3 08/20/2017 1303   CL 101 08/20/2017 1303   CO2 24 08/20/2017 1303   GLUCOSE 83 08/20/2017 1303   GLUCOSE 95 09/03/2016 0950   BUN 7 08/20/2017 1303   CREATININE 0.95 08/20/2017 1303   CALCIUM 9.4  08/20/2017 1303   GFRNONAA 80 08/20/2017 1303   GFRAA 92 08/20/2017 1303   Lab Results  Component Value Date   HGBA1C 5.6 08/20/2017   Lab Results  Component Value Date   INSULIN 12.2 08/20/2017   CBC    Component Value Date/Time   WBC 7.6 08/20/2017 1303   WBC 7.9 09/03/2016 0950   RBC 4.41 08/20/2017 1303   RBC 4.37 09/03/2016 0950   HGB 12.0 08/20/2017 1303   HCT 36.9 08/20/2017 1303   PLT 327.0 09/03/2016 0950   MCV 84 08/20/2017 1303   MCH 27.2 08/20/2017 1303   MCHC 32.5 08/20/2017 1303   MCHC 33.0 09/03/2016 0950   RDW 14.2 08/20/2017 1303   LYMPHSABS 3.5 (H) 08/20/2017 1303   MONOABS 0.5 09/03/2016 0950   EOSABS 0.1 08/20/2017 1303   BASOSABS 0.0 08/20/2017 1303   Iron/TIBC/Ferritin/ %Sat No results found for: IRON, TIBC, FERRITIN, IRONPCTSAT Lipid Panel     Component Value Date/Time   CHOL 172 08/20/2017 1303   TRIG 83 08/20/2017 1303   HDL 52 08/20/2017 1303   CHOLHDL 4 09/03/2016 0950   VLDL 14.8 09/03/2016 0950   LDLCALC 103 (H) 08/20/2017 1303   Hepatic Function Panel     Component Value Date/Time   PROT 6.8 08/20/2017 1303   ALBUMIN 4.0 08/20/2017 1303   AST 12 08/20/2017 1303   ALT 6 08/20/2017 1303   ALKPHOS 39 08/20/2017 1303   BILITOT 0.3 08/20/2017 1303   BILIDIR 0.1 09/03/2016 0950      Component Value Date/Time   TSH 1.340 08/20/2017 1303   TSH 1.64 09/03/2016 0950   TSH 1.34 08/16/2015 1003   Results for OSIRIS, CHARLES (MRN [id number]) as of 09/19/2017 13:58  Ref. Range 08/20/2017 13:03  Vitamin D, 25-Hydroxy Latest Ref Range: 30.0 - 100.0 ng/mL 44.3   ASSESSMENT AND PLAN: Prediabetes  Class 1 obesity with serious comorbidity and body mass index (BMI) of 33.0 to 33.9 in adult, unspecified obesity type  PLAN:  Pre-Diabetes Shannon Anderson will continue to work on weight loss, exercise, and decreasing simple carbohydrates in her diet to help decrease the risk of diabetes. She was informed that eating too many simple carbohydrates  or too many calories at one sitting increases the likelihood of GI side effects. [name] will work on increasing lean protein intake.Shannon Anderson agreed to follow up with [place] as directed to monitor her progress.  We spent > than 50% of the 15 minute visit on the counseling as documented in the note  Obesity Shannon Anderson is currently in the action stage of change. As such, her goal is to continue with weight loss efforts She has agreed to keep a food journal with 1200 to 1500 calories and 80 grams of protein daily Shannon Anderson has been instructed to work up to a goal of  150 minutes of combined cardio and strengthening exercise per week for weight loss and overall health benefits. We discussed the following Behavioral Modification Strategies today: increasing lean protein intake, decreasing simple carbohydrates , increasing vegetables and work on meal planning and easy cooking plans  Shannon Anderson has agreed to follow up with our clinic in 2 to 3 weeks. She was informed of the importance of frequent follow up visits to maximize her success with intensive lifestyle modifications for her multiple health conditions.   OBESITY BEHAVIORAL INTERVENTION VISIT  Today's visit was # 3 out of 22.  Starting weight: 220 lbs Starting date: 08/20/17 Today's weight : 216 lbs  Today's date: 09/19/2017 Total lbs lost to date: 4 (Patients must lose 7 lbs in the first 6 months to continue with counseling)   ASK: We discussed the diagnosis of obesity with Shannon Anderson today and Shannon Anderson agreed to give us permission to discuss obesity behavioral modification therapy today.  ASSESS: Shannon Anderson has the diagnosis of obesity and her BMI today is 33.35 Cella is in the action stage of change   ADVISE: Shannon Anderson was educated on the multiple health risks of obesity as well as the benefit of weight loss to improve her health. She was advised of the need for long term treatment and the importance of lifestyle  modifications.  AGREE: Multiple dietary modification options and treatment options were discussed and  Tashanda agreed to the above obesity treatment plan.  I, [place] CraneJoanne Murray, am acting as transcriptionist for Quillian Quincearen Baylie Drakes, MD  I have reviewed the above documentation for accuracy and completeness, and I agree with the above. -Quillian Quincearen Tyresse Jayson, MD

## 2017-09-20 ENCOUNTER — Telehealth: Payer: Self-pay | Admitting: Family Medicine

## 2017-09-20 NOTE — Telephone Encounter (Signed)
These orders are in they system for [facility]. Please clarify if this is ok. They have tried calling pt to schedule and have left messages.

## 2017-09-20 NOTE — Telephone Encounter (Signed)
Copied from CRM [id number]. Topic: Quick Communication - See Telephone Encounter >> Sep 20, 2017 11:11 AM Shannon Anderson, Rosey Batheresa D wrote: CRM for notification. See Telephone encounter for: 09/20/17. Patient would like a order for a thyroid ultrasound in [facility] in [place] so she can make the appt. If any questions please call patient, thanks.

## 2017-09-20 NOTE — Telephone Encounter (Signed)
Confirmed preferred location and orders with patient. She will call them today to schedule.

## 2017-09-23 ENCOUNTER — Encounter: Payer: Self-pay | Admitting: Family Medicine

## 2017-09-25 MED ORDER — ESZOPICLONE 2 MG PO TABS: 2.0000 mg | ORAL_TABLET | Freq: Every evening | ORAL | 3 refills | Status: DC | PRN

## 2017-09-30 ENCOUNTER — Encounter: Payer: Self-pay | Admitting: Family Medicine

## 2017-09-30 MED ORDER — ELETRIPTAN HYDROBROMIDE 40 MG PO TABS: 40.0000 mg | ORAL_TABLET | ORAL | 3 refills | Status: DC | PRN

## 2017-10-01 ENCOUNTER — Ambulatory Visit
Admission: RE | Admit: 2017-10-01 | Discharge: 2017-10-01 | Disposition: A | Discharge status: Home / Self Care | Payer: No Typology Code available for payment source | Source: Ambulatory Visit | Attending: Family Medicine | Admitting: Family Medicine

## 2017-10-01 DIAGNOSIS — E010 Iodine-deficiency related diffuse (endemic) goiter: Secondary | ICD-10-CM | POA: Diagnosis not present

## 2017-10-10 ENCOUNTER — Ambulatory Visit (INDEPENDENT_AMBULATORY_CARE_PROVIDER_SITE_OTHER): Payer: No Typology Code available for payment source | Admitting: Physician Assistant

## 2017-10-14 ENCOUNTER — Ambulatory Visit (INDEPENDENT_AMBULATORY_CARE_PROVIDER_SITE_OTHER): Payer: No Typology Code available for payment source | Admitting: Family Medicine

## 2017-10-14 VITALS — BP 116/77 | HR 61 | Temp 98.1°F | Ht 67.0 in | Wt 218.0 lb

## 2017-10-14 DIAGNOSIS — Z6834 Body mass index (BMI) 34.0-34.9, adult: Secondary | ICD-10-CM

## 2017-10-14 DIAGNOSIS — E7849 Other hyperlipidemia: Secondary | ICD-10-CM | POA: Diagnosis not present

## 2017-10-14 DIAGNOSIS — E669 Obesity, unspecified: Secondary | ICD-10-CM | POA: Diagnosis not present

## 2017-10-14 DIAGNOSIS — R7303 Prediabetes: Secondary | ICD-10-CM

## 2017-10-14 NOTE — Progress Notes (Signed)
Office: [phone]  /  Fax: [phone]   HPI:   Chief Complaint: OBESITY Shannon Anderson is here to discuss her progress with her obesity treatment plan. She is on the keep a food journal with 1200-1500 calories and 80 grams of protein daily and is following her eating plan approximately 60 % of the time. She states she is doing cardio for 30-60 minutes 3 times per week. Shannon Anderson has had a breakup over the past 3 weeks. Has increased exercise and hasn't been very consistent with journaling. Hasn't been consistently hitting protein goal.  Her weight is 218 lb (98.9 kg) today and has gained 5 pounds since her last visit. She has lost 2 lbs since starting treatment with [place].  Pre-Diabetes Shannon Anderson has a diagnosis of pre-diabetes based on her elevated Hgb A1c and was informed this puts her at greater risk of developing diabetes. She is not consistently journaling and she notes carbohydrate cravings. She is not taking metformin currently and continues to work on diet and exercise to decrease risk of diabetes. She denies nausea or hypoglycemia.  Hyperlipidemia Shannon Anderson has hyperlipidemia and has been trying to improve her cholesterol levels with intensive lifestyle modification including a low saturated fat diet, exercise and weight loss. Last LDL of 103. She denies any chest pain, claudication or myalgias.  ALLERGIES: Allergies  Allergen Reactions  . Monistat 1 [Tioconazole] Swelling  . Other Other (See Comments)    Artificial sweetners - migraines MSG - migraines    MEDICATIONS: Current Outpatient Medications on File Prior to Visit  Medication Sig Dispense Refill  . eletriptan (RELPAX) 40 MG tablet Take 1 tablet (40 mg total) by mouth as needed for migraine or headache. May repeat in 2 hours if headache persists or recurs. 10 tablet 3  . eszopiclone (LUNESTA) 2 MG TABS tablet Take 1 tablet (2 mg total) by mouth at bedtime as needed for sleep. Take immediately before bedtime 30 tablet 3  .  etonogestrel-ethinyl estradiol (NUVARING) 0.12-0.015 MG/24HR vaginal ring Place 1 each vaginally every 28 (twenty-eight) days. Insert vaginally and leave in place for 3 consecutive weeks, then remove for 1 week.    . Multiple Vitamin (MULTIVITAMIN) capsule Take 1 capsule by mouth daily.    . temazepam (RESTORIL) 7.5 MG capsule Take 1 capsule (7.5 mg total) by mouth at bedtime as needed for sleep. 30 capsule 1   No current facility-administered medications on file prior to visit.     PAST MEDICAL HISTORY: Past Medical History:  Diagnosis Date  . Depression   . Migraine   . Vitamin D deficiency     PAST SURGICAL HISTORY: No past surgical history on file.  SOCIAL HISTORY: Social History   Tobacco Use  . Smoking status: Never Smoker  . Smokeless tobacco: Never Used  Substance Use Topics  . Alcohol use: Yes    Comment: rarely  . Drug use: Not on file    FAMILY HISTORY: Family History  Problem Relation Age of Onset  . Hypertension Mother   . Cancer Mother 53       breast  . Breast cancer Mother   . Depression Mother   . Hypertension Father   . Diabetes Sister   . Cancer Maternal Aunt   . Breast cancer Maternal Aunt   . Stroke Maternal Grandmother   . Diabetes Paternal Grandmother   . Breast cancer Cousin     ROS: Review of Systems  Constitutional: Negative for weight loss.  Cardiovascular: Negative for chest pain and claudication.  Gastrointestinal: Negative for nausea.  Musculoskeletal: Negative for myalgias.  Endo/Heme/Allergies:       Negative hypoglycemia    PHYSICAL EXAM: Blood pressure 116/77, pulse 61, temperature 98.1 F (36.7 C), temperature source Oral, height 5\' 7"  (1.702 m), weight 218 lb (98.9 kg), SpO2 100 %. Body mass index is 34.14 kg/m. Physical Exam  Constitutional: She is oriented to person, place, and time. She appears well-developed and well-nourished.  Cardiovascular: Normal rate.  Pulmonary/Chest: Effort normal.  Musculoskeletal:  Normal range of motion.  Neurological: She is oriented to person, place, and time.  Skin: Skin is warm and dry.  Psychiatric: She has a normal mood and affect. Her behavior is normal.  Vitals reviewed.   RECENT LABS AND TESTS: BMET    Component Value Date/Time   NA 137 08/20/2017 1303   K 4.3 08/20/2017 1303   CL 101 08/20/2017 1303   CO2 24 08/20/2017 1303   GLUCOSE 83 08/20/2017 1303   GLUCOSE 95 09/03/2016 0950   BUN 7 08/20/2017 1303   CREATININE 0.95 08/20/2017 1303   CALCIUM 9.4 08/20/2017 1303   GFRNONAA 80 08/20/2017 1303   GFRAA 92 08/20/2017 1303   Lab Results  Component Value Date   HGBA1C 5.6 08/20/2017   Lab Results  Component Value Date   INSULIN 12.2 08/20/2017   CBC    Component Value Date/Time   WBC 7.6 08/20/2017 1303   WBC 7.9 09/03/2016 0950   RBC 4.41 08/20/2017 1303   RBC 4.37 09/03/2016 0950   HGB 12.0 08/20/2017 1303   HCT 36.9 08/20/2017 1303   PLT 327.0 09/03/2016 0950   MCV 84 08/20/2017 1303   MCH 27.2 08/20/2017 1303   MCHC 32.5 08/20/2017 1303   MCHC 33.0 09/03/2016 0950   RDW 14.2 08/20/2017 1303   LYMPHSABS 3.5 (H) 08/20/2017 1303   MONOABS 0.5 09/03/2016 0950   EOSABS 0.1 08/20/2017 1303   BASOSABS 0.0 08/20/2017 1303   Iron/TIBC/Ferritin/ %Sat No results found for: IRON, TIBC, FERRITIN, IRONPCTSAT Lipid Panel     Component Value Date/Time   CHOL 172 08/20/2017 1303   TRIG 83 08/20/2017 1303   HDL 52 08/20/2017 1303   CHOLHDL 4 09/03/2016 0950   VLDL 14.8 09/03/2016 0950   LDLCALC 103 (H) 08/20/2017 1303   Hepatic Function Panel     Component Value Date/Time   PROT 6.8 08/20/2017 1303   ALBUMIN 4.0 08/20/2017 1303   AST 12 08/20/2017 1303   ALT 6 08/20/2017 1303   ALKPHOS 39 08/20/2017 1303   BILITOT 0.3 08/20/2017 1303   BILIDIR 0.1 09/03/2016 0950      Component Value Date/Time   TSH 1.340 08/20/2017 1303   TSH 1.64 09/03/2016 0950   TSH 1.34 08/16/2015 1003    ASSESSMENT AND  PLAN: Prediabetes  Other hyperlipidemia  Class 1 obesity with serious comorbidity and body mass index (BMI) of 34.0 to 34.9 in adult, unspecified obesity type  PLAN:  Pre-Diabetes Shannon Anderson is to bring in food journal at next appointment. She will continue to work on weight loss, exercise, and decreasing simple carbohydrates in her diet to help decrease the risk of diabetes. We dicussed metformin including benefits and risks. She was informed that eating too many simple carbohydrates or too many calories at one sitting increases the likelihood of GI side effects. Shannon Anderson declined metformin for now and a prescription was not written today. Shannon Anderson agrees to follow up with our clinic in 2 weeks as directed to monitor her progress.  Hyperlipidemia  Shannon Anderson was informed of the American Heart Association Guidelines emphasizing intensive lifestyle modifications as the first line treatment for hyperlipidemia. We discussed many lifestyle modifications today in depth, and Shannon Anderson will continue to work on decreasing saturated fats such as fatty red meat, butter and many fried foods. She will also increase vegetables and lean protein in her diet and continue to work on exercise and weight loss efforts. We will recheck FLP in early September and Shannon Anderson agrees to follow up with our clinic in 2 weeks.  We spent > than 50% of the 15 minute visit on the counseling as documented in the note.  Obesity Shannon Anderson is currently in the action stage of change. As such, her goal is to continue with weight loss efforts She has agreed to keep a food journal with 1200-1500 calories and 80 grams of protein daily Shannon Anderson has been instructed to work up to a goal of 150 minutes of combined cardio and strengthening exercise per week for weight loss and overall health benefits. We discussed the following Behavioral Modification Strategies today: increasing lean protein intake, increasing vegetables, work on meal planning and easy  cooking plans, celebration eating strategies, and no skipping meals    Shannon Anderson has agreed to follow up with our clinic in 2 weeks. She was informed of the importance of frequent follow up visits to maximize her success with intensive lifestyle modifications for her multiple health conditions.   OBESITY BEHAVIORAL INTERVENTION VISIT  Today's visit was # 4 out of 22.  Starting weight: 220 lbs Starting date: 08/20/17 Today's weight : 218 lbs  Today's date: 10/14/2017 Total lbs lost to date: 2    ASK: We discussed the diagnosis of obesity with Shannon Anderson today and Shannon Anderson agreed to give [place] permission to discuss obesity behavioral modification therapy today.  ASSESS: Shannon Anderson has the diagnosis of obesity and her BMI today is 34.14 Shannon Anderson is in the action stage of change   ADVISE: Shannon Anderson was educated on the multiple health risks of obesity as well as the benefit of weight loss to improve her health. She was advised of the need for long term treatment and the importance of lifestyle modifications.  AGREE: Multiple dietary modification options and treatment options were discussed and  Shannon Anderson agreed to the above obesity treatment plan.  I, Burt Knack, am acting as transcriptionist for Debbra Riding, MD  I have reviewed the above documentation for accuracy and completeness, and I agree with the above. - Debbra Riding, MD

## 2017-10-31 ENCOUNTER — Ambulatory Visit (INDEPENDENT_AMBULATORY_CARE_PROVIDER_SITE_OTHER): Payer: No Typology Code available for payment source | Admitting: Family Medicine

## 2017-10-31 VITALS — BP 106/71 | HR 62 | Temp 97.9°F | Ht 67.0 in | Wt 213.0 lb

## 2017-10-31 DIAGNOSIS — Z6833 Body mass index (BMI) 33.0-33.9, adult: Secondary | ICD-10-CM

## 2017-10-31 DIAGNOSIS — E8881 Metabolic syndrome: Secondary | ICD-10-CM

## 2017-10-31 DIAGNOSIS — E7849 Other hyperlipidemia: Secondary | ICD-10-CM | POA: Diagnosis not present

## 2017-10-31 DIAGNOSIS — E669 Obesity, unspecified: Secondary | ICD-10-CM

## 2017-10-31 NOTE — Progress Notes (Signed)
Office: [phone]  /  Fax: [phone]   HPI:   Chief Complaint: OBESITY Shannon Anderson is here to discuss her progress with her obesity treatment plan. She is on the keep a food journal with 1200-1500 calories and 80 grams of protein daily and is following her eating plan approximately 50 % of the time. She states she is exercising 0 minutes 0 times per week. Shannon Anderson started drinking protein shakes to hit protein goal (hitting protein goal 8 days). Staying within calories most days. Been more mindful of food.  Her weight is 213 lb (96.6 kg) today and has had a weight loss of 5 pounds over a period of 2 to 3 weeks since her last visit. She has lost 7 lbs since starting treatment with [place].  Insulin Resistance Shannon Anderson has a diagnosis of insulin resistance based on her elevated fasting insulin level >5. Although Shannon Anderson's blood glucose readings are still under good control, insulin resistance puts her at greater risk of metabolic syndrome and diabetes. She noticed prior to the program she was eating too many carbohydrates. She is not taking metformin currently and continues to work on diet and exercise to decrease risk of diabetes.  Hyperlipidemia Shannon Anderson has hyperlipidemia and has been trying to improve her cholesterol levels with intensive lifestyle modification including a low saturated fat diet, exercise and weight loss. Slightly elevated LDL. She denies any chest pain, claudication or myalgias.  ALLERGIES: Allergies  Allergen Reactions  . Monistat 1 [Tioconazole] Swelling  . Other Other (See Comments)    Artificial sweetners - migraines MSG - migraines    MEDICATIONS: Current Outpatient Medications on File Prior to Visit  Medication Sig Dispense Refill  . eletriptan (RELPAX) 40 MG tablet Take 1 tablet (40 mg total) by mouth as needed for migraine or headache. May repeat in 2 hours if headache persists or recurs. 10 tablet 3  . eszopiclone (LUNESTA) 2 MG TABS tablet Take 1 tablet (2 mg  total) by mouth at bedtime as needed for sleep. Take immediately before bedtime 30 tablet 3  . etonogestrel-ethinyl estradiol (NUVARING) 0.12-0.015 MG/24HR vaginal ring Place 1 each vaginally every 28 (twenty-eight) days. Insert vaginally and leave in place for 3 consecutive weeks, then remove for 1 week.    . Multiple Vitamin (MULTIVITAMIN) capsule Take 1 capsule by mouth daily.    . temazepam (RESTORIL) 7.5 MG capsule Take 1 capsule (7.5 mg total) by mouth at bedtime as needed for sleep. 30 capsule 1   No current facility-administered medications on file prior to visit.     PAST MEDICAL HISTORY: Past Medical History:  Diagnosis Date  . Depression   . Migraine   . Vitamin D deficiency     PAST SURGICAL HISTORY: No past surgical history on file.  SOCIAL HISTORY: Social History   Tobacco Use  . Smoking status: Never Smoker  . Smokeless tobacco: Never Used  Substance Use Topics  . Alcohol use: Yes    Comment: rarely  . Drug use: Not on file    FAMILY HISTORY: Family History  Problem Relation Age of Onset  . Hypertension Mother   . Cancer Mother 57       breast  . Breast cancer Mother   . Depression Mother   . Hypertension Father   . Diabetes Sister   . Cancer Maternal Aunt   . Breast cancer Maternal Aunt   . Stroke Maternal Grandmother   . Diabetes Paternal Grandmother   . Breast cancer Cousin  ROS: Review of Systems  Constitutional: Positive for weight loss.  Cardiovascular: Negative for chest pain and claudication.  Musculoskeletal: Negative for myalgias.    PHYSICAL EXAM: Blood pressure 106/71, pulse 62, temperature 97.9 F (36.6 C), temperature source Oral, height 5\' 7"  (1.702 m), weight 213 lb (96.6 kg), SpO2 100 %. Body mass index is 33.36 kg/m. Physical Exam  Constitutional: She is oriented to person, place, and time. She appears well-developed and well-nourished.  Cardiovascular: Normal rate.  Pulmonary/Chest: Effort normal.  Musculoskeletal:  Normal range of motion.  Neurological: She is oriented to person, place, and time.  Skin: Skin is warm and dry.  Psychiatric: She has a normal mood and affect. Her behavior is normal.  Vitals reviewed.   RECENT LABS AND TESTS: BMET    Component Value Date/Time   NA 137 08/20/2017 1303   K 4.3 08/20/2017 1303   CL 101 08/20/2017 1303   CO2 24 08/20/2017 1303   GLUCOSE 83 08/20/2017 1303   GLUCOSE 95 09/03/2016 0950   BUN 7 08/20/2017 1303   CREATININE 0.95 08/20/2017 1303   CALCIUM 9.4 08/20/2017 1303   GFRNONAA 80 08/20/2017 1303   GFRAA 92 08/20/2017 1303   Lab Results  Component Value Date   HGBA1C 5.6 08/20/2017   Lab Results  Component Value Date   INSULIN 12.2 08/20/2017   CBC    Component Value Date/Time   WBC 7.6 08/20/2017 1303   WBC 7.9 09/03/2016 0950   RBC 4.41 08/20/2017 1303   RBC 4.37 09/03/2016 0950   HGB 12.0 08/20/2017 1303   HCT 36.9 08/20/2017 1303   PLT 327.0 09/03/2016 0950   MCV 84 08/20/2017 1303   MCH 27.2 08/20/2017 1303   MCHC 32.5 08/20/2017 1303   MCHC 33.0 09/03/2016 0950   RDW 14.2 08/20/2017 1303   LYMPHSABS 3.5 (H) 08/20/2017 1303   MONOABS 0.5 09/03/2016 0950   EOSABS 0.1 08/20/2017 1303   BASOSABS 0.0 08/20/2017 1303   Iron/TIBC/Ferritin/ %Sat No results found for: IRON, TIBC, FERRITIN, IRONPCTSAT Lipid Panel     Component Value Date/Time   CHOL 172 08/20/2017 1303   TRIG 83 08/20/2017 1303   HDL 52 08/20/2017 1303   CHOLHDL 4 09/03/2016 0950   VLDL 14.8 09/03/2016 0950   LDLCALC 103 (H) 08/20/2017 1303   Hepatic Function Panel     Component Value Date/Time   PROT 6.8 08/20/2017 1303   ALBUMIN 4.0 08/20/2017 1303   AST 12 08/20/2017 1303   ALT 6 08/20/2017 1303   ALKPHOS 39 08/20/2017 1303   BILITOT 0.3 08/20/2017 1303   BILIDIR 0.1 09/03/2016 0950      Component Value Date/Time   TSH 1.340 08/20/2017 1303   TSH 1.64 09/03/2016 0950   TSH 1.34 08/16/2015 1003    ASSESSMENT AND PLAN: Insulin  resistance  Other hyperlipidemia  Class 1 obesity with serious comorbidity and body mass index (BMI) of 33.0 to 33.9 in adult, unspecified obesity type  PLAN:  Insulin Resistance Shannon Anderson will continue to work on weight loss, exercise, and decreasing simple carbohydrates in her diet to help decrease the risk of diabetes. We dicussed metformin including benefits and risks. She was informed that eating too many simple carbohydrates or too many calories at one sitting increases the likelihood of GI side effects. Shannon Anderson declined metformin for now and prescription was not written today. We will repeat labs in early September. Shannon Anderson agrees to follow up with our clinic in 2 weeks as directed to monitor her progress.  Hyperlipidemia Shannon Anderson was informed of the American Heart Association Guidelines emphasizing intensive lifestyle modifications as the first line treatment for hyperlipidemia. We discussed many lifestyle modifications today in depth, and Shannon Anderson will continue to work on decreasing saturated fats such as fatty red meat, butter and many fried foods. She will also increase vegetables and lean protein in her diet and continue to work on exercise and weight loss efforts. We will repeat labs in early September, Shannon Anderson was encouraged to limit fatty foods. Shannon Anderson agrees to follow up with our clinic in 2 weeks.  We spent > than 50% of the 15 minute visit on the counseling as documented in the note.  Obesity Shannon Anderson is currently in the action stage of change. As such, her goal is to continue with weight loss efforts She has agreed to keep a food journal with 1300-1500 calories and 80 grams of protein daily Shannon Anderson has been instructed to work up to a goal of 150 minutes of combined cardio and strengthening exercise per week for weight loss and overall health benefits. We discussed the following Behavioral Modification Strategies today: increasing lean protein intake, increasing vegetables, work on  meal planning and easy cooking plans, planning for success, and keep a strict food journal   Shannon Anderson has agreed to follow up with our clinic in 2 weeks. She was informed of the importance of frequent follow up visits to maximize her success with intensive lifestyle modifications for her multiple health conditions.   OBESITY BEHAVIORAL INTERVENTION VISIT  Today's visit was # 5 out of 22.  Starting weight: 220 lbs Starting date: 08/20/17 Today's weight : 213 lbs  Today's date: 10/31/2017 Total lbs lost to date: 7    ASK: We discussed the diagnosis of obesity with Shannon Anderson E Berti today and Shannon Anderson agreed to give us permission to discuss obesity behavioral modification therapy today.  ASSESS: Shannon Anderson has the diagnosis of obesity and her BMI today is 33.35 Shannon Anderson is in the action stage of change   ADVISE: Shannon Anderson was educated on the multiple health risks of obesity as well as the benefit of weight loss to improve her health. She was advised of the need for long term treatment and the importance of lifestyle modifications.  AGREE: Multiple dietary modification options and treatment options were discussed and  Shannon Anderson agreed to the above obesity treatment plan.  I, Burt KnackSharon Martin, am acting as transcriptionist for Debbra RidingAlexandria Kadolph, MD  I have reviewed the above documentation for accuracy and completeness, and I agree with the above. - Debbra RidingAlexandria Kadolph, MD

## 2017-11-19 ENCOUNTER — Ambulatory Visit (INDEPENDENT_AMBULATORY_CARE_PROVIDER_SITE_OTHER): Payer: No Typology Code available for payment source | Admitting: Physician Assistant

## 2017-11-19 VITALS — BP 104/70 | HR 69 | Temp 98.2°F | Ht 67.0 in | Wt 215.0 lb

## 2017-11-19 DIAGNOSIS — E8881 Metabolic syndrome: Secondary | ICD-10-CM

## 2017-11-19 DIAGNOSIS — E669 Obesity, unspecified: Secondary | ICD-10-CM | POA: Diagnosis not present

## 2017-11-19 DIAGNOSIS — Z6833 Body mass index (BMI) 33.0-33.9, adult: Secondary | ICD-10-CM

## 2017-11-19 NOTE — Progress Notes (Signed)
Office: [phone]  /  Fax: [phone]   HPI:   Chief Complaint: OBESITY Shannon Anderson is here to discuss her progress with her obesity treatment plan. She is on the keep a food journal with 1300-1500 calories and 80 grams of protein daily and is following her eating plan approximately 50 % of the time. She states she is doing boot camp for 45 minutes 2 times per week. Shannon Anderson struggled to get in all the protein due to being a vegetarian. She reports that she is working 3rd shift which presents a challenge as well. She is ready to get back on track.  Her weight is 215 lb (97.5 kg) today and has gained 2 pounds since her last visit. She has lost 5 lbs since starting treatment with Shannon Anderson.  Insulin Resistance Shannon Anderson has a diagnosis of insulin resistance based on her elevated fasting insulin level >5. Although Shannon Anderson's blood glucose readings are still under good control, insulin resistance puts her at greater risk of metabolic syndrome and diabetes. She denies polyphagia and last level not at goal. She is not taking metformin currently and continues to work on diet and exercise to decrease risk of diabetes.  ALLERGIES: Allergies  Allergen Reactions  . Monistat 1 [Tioconazole] Swelling  . Other Other (See Comments)    Artificial sweetners - migraines MSG - migraines    MEDICATIONS: Current Outpatient Medications on File Prior to Visit  Medication Sig Dispense Refill  . eletriptan (RELPAX) 40 MG tablet Take 1 tablet (40 mg total) by mouth as needed for migraine or headache. May repeat in 2 hours if headache persists or recurs. 10 tablet 3  . eszopiclone (LUNESTA) 2 MG TABS tablet Take 1 tablet (2 mg total) by mouth at bedtime as needed for sleep. Take immediately before bedtime 30 tablet 3  . etonogestrel-ethinyl estradiol (NUVARING) 0.12-0.015 MG/24HR vaginal ring Place 1 each vaginally every 28 (twenty-eight) days. Insert vaginally and leave in place for 3 consecutive weeks, then remove for 1  week.    . Multiple Vitamin (MULTIVITAMIN) capsule Take 1 capsule by mouth daily.    . temazepam (RESTORIL) 7.5 MG capsule Take 1 capsule (7.5 mg total) by mouth at bedtime as needed for sleep. 30 capsule 1   No current facility-administered medications on file prior to visit.     PAST MEDICAL HISTORY: Past Medical History:  Diagnosis Date  . Depression   . Migraine   . Vitamin D deficiency     PAST SURGICAL HISTORY: No past surgical history on file.  SOCIAL HISTORY: Social History   Tobacco Use  . Smoking status: Never Smoker  . Smokeless tobacco: Never Used  Substance Use Topics  . Alcohol use: Yes    Comment: rarely  . Drug use: Not on file    FAMILY HISTORY: Family History  Problem Relation Age of Onset  . Hypertension Mother   . Cancer Mother [id number]       breast  . Breast cancer Mother   . Depression Mother   . Hypertension Father   . Diabetes Sister   . Cancer Maternal Aunt   . Breast cancer Maternal Aunt   . Stroke Maternal Grandmother   . Diabetes Paternal Grandmother   . Breast cancer Cousin     ROS: Review of Systems  Constitutional: Negative for weight loss.  Endo/Heme/Allergies:       Negative polyphagia    PHYSICAL EXAM: Blood pressure 104/70, pulse 69, temperature 98.2 F (36.8 C), temperature source Oral, height 5'  7" (1.702 m), weight 215 lb (97.5 kg), last menstrual period 10/28/2017, SpO2 100 %. Body mass index is 33.67 kg/m. Physical Exam  Constitutional: She is oriented to person, place, and time. She appears well-developed and well-nourished.  Cardiovascular: Normal rate.  Pulmonary/Chest: Effort normal.  Musculoskeletal: Normal range of motion.  Neurological: She is oriented to person, place, and time.  Skin: Skin is warm and dry.  Psychiatric: She has a normal mood and affect. Her behavior is normal.  Vitals reviewed.   RECENT LABS AND TESTS: BMET    Component Value Date/Time   NA 137 08/20/2017 1303   K 4.3 08/20/2017 1303    CL 101 08/20/2017 1303   CO2 24 08/20/2017 1303   GLUCOSE 83 08/20/2017 1303   GLUCOSE 95 09/03/2016 0950   BUN 7 08/20/2017 1303   CREATININE 0.95 08/20/2017 1303   CALCIUM 9.4 08/20/2017 1303   GFRNONAA 80 08/20/2017 1303   GFRAA 92 08/20/2017 1303   Lab Results  Component Value Date   HGBA1C 5.6 08/20/2017   Lab Results  Component Value Date   INSULIN 12.2 08/20/2017   CBC    Component Value Date/Time   WBC 7.6 08/20/2017 1303   WBC 7.9 09/03/2016 0950   RBC 4.41 08/20/2017 1303   RBC 4.37 09/03/2016 0950   HGB 12.0 08/20/2017 1303   HCT 36.9 08/20/2017 1303   PLT 327.0 09/03/2016 0950   MCV 84 08/20/2017 1303   MCH 27.2 08/20/2017 1303   MCHC 32.5 08/20/2017 1303   MCHC 33.0 09/03/2016 0950   RDW 14.2 08/20/2017 1303   LYMPHSABS 3.5 (H) 08/20/2017 1303   MONOABS 0.5 09/03/2016 0950   EOSABS 0.1 08/20/2017 1303   BASOSABS 0.0 08/20/2017 1303   Iron/TIBC/Ferritin/ %Sat No results found for: IRON, TIBC, FERRITIN, IRONPCTSAT Lipid Panel     Component Value Date/Time   CHOL 172 08/20/2017 1303   TRIG 83 08/20/2017 1303   HDL 52 08/20/2017 1303   CHOLHDL 4 09/03/2016 0950   VLDL 14.8 09/03/2016 0950   LDLCALC 103 (H) 08/20/2017 1303   Hepatic Function Panel     Component Value Date/Time   PROT 6.8 08/20/2017 1303   ALBUMIN 4.0 08/20/2017 1303   AST 12 08/20/2017 1303   ALT 6 08/20/2017 1303   ALKPHOS 39 08/20/2017 1303   BILITOT 0.3 08/20/2017 1303   BILIDIR 0.1 09/03/2016 0950      Component Value Date/Time   TSH 1.340 08/20/2017 1303   TSH 1.64 09/03/2016 0950   TSH 1.34 08/16/2015 1003    ASSESSMENT AND PLAN: Insulin resistance  Class 1 obesity with serious comorbidity and body mass index (BMI) of 33.0 to 33.9 in adult, unspecified obesity type  PLAN:  Insulin Resistance Shannon Anderson will continue to work on weight loss, diet, exercise, and decreasing simple carbohydrates in her diet to help decrease the risk of diabetes. We dicussed  metformin including benefits and risks. She was informed that eating too many simple carbohydrates or too many calories at one sitting increases the likelihood of GI side effects. Shannon Anderson declined metformin for now and prescription was not written today. Shannon Anderson agrees to follow up with our clinic in 2 weeks as directed to monitor her progress.  We spent > than 50% of the 15 minute visit on the counseling as documented in the note.  Obesity Shannon Anderson is currently in the action stage of change. As such, her goal is to continue with weight loss efforts She has agreed to keep a food  journal with 1300-1500 calories and 80 grams of protein daily Shannon Anderson has been instructed to work up to a goal of 150 minutes of combined cardio and strengthening exercise per week for weight loss and overall health benefits. We discussed the following Behavioral Modification Strategies today: increasing lean protein intake and no skipping meals   Shannon Anderson has agreed to follow up with our clinic in 2 weeks. She was informed of the importance of frequent follow up visits to maximize her success with intensive lifestyle modifications for her multiple health conditions.   OBESITY BEHAVIORAL INTERVENTION VISIT  Today's visit was # 6 out of 22.  Starting weight: 220 lbs Starting date: 08/20/17 Today's weight : 215 lbs  Today's date: 11/19/2017 Total lbs lost to date: 5    ASK: We discussed the diagnosis of obesity with Shannon Anderson today and Avika agreed to give Shannon Anderson permission to discuss obesity behavioral modification therapy today.  ASSESS: Shannon Anderson has the diagnosis of obesity and her BMI today is 33.67 Shannon Anderson is in the action stage of change   ADVISE: Shannon Anderson was educated on the multiple health risks of obesity as well as the benefit of weight loss to improve her health. She was advised of the need for long term treatment and the importance of lifestyle modifications.  AGREE: Multiple dietary  modification options and treatment options were discussed and  Shannon Anderson agreed to the above obesity treatment plan.  Trude McburneyI, Sharon Martin, am acting as transcriptionist for Alois Clicheracey Merci Walthers, PA-C I, Alois Clicheracey Jermone Geister, PA-C have reviewed above note and agree with its content

## 2017-11-27 ENCOUNTER — Ambulatory Visit (INDEPENDENT_AMBULATORY_CARE_PROVIDER_SITE_OTHER): Payer: No Typology Code available for payment source | Admitting: Family Medicine

## 2017-11-27 ENCOUNTER — Other Ambulatory Visit (HOSPITAL_COMMUNITY)
Admission: RE | Admit: 2017-11-27 | Discharge: 2017-11-27 | Disposition: A | Discharge status: Home / Self Care | Payer: No Typology Code available for payment source | Source: Ambulatory Visit | Attending: Family Medicine | Admitting: Family Medicine

## 2017-11-27 ENCOUNTER — Encounter: Payer: Self-pay | Admitting: Family Medicine

## 2017-11-27 ENCOUNTER — Other Ambulatory Visit: Payer: Self-pay

## 2017-11-27 VITALS — BP 124/82 | HR 66 | Temp 98.5°F | Resp 16 | Ht 67.0 in | Wt 222.4 lb

## 2017-11-27 DIAGNOSIS — R61 Generalized hyperhidrosis: Secondary | ICD-10-CM | POA: Diagnosis not present

## 2017-11-27 DIAGNOSIS — Z202 Contact with and (suspected) exposure to infections with a predominantly sexual mode of transmission: Secondary | ICD-10-CM | POA: Insufficient documentation

## 2017-11-27 DIAGNOSIS — N898 Other specified noninflammatory disorders of vagina: Secondary | ICD-10-CM | POA: Diagnosis not present

## 2017-11-27 LAB — BASIC METABOLIC PANEL
BUN: 13 mg/dL (ref 6–23)
CO2: 26 mEq/L (ref 19–32)
CREATININE: 0.94 mg/dL (ref 0.40–1.20)
Calcium: 9.2 mg/dL (ref 8.4–10.5)
Chloride: 105 mEq/L (ref 96–112)
GFR: 88.87 mL/min (ref >60.00)
Glucose, Bld: 89 mg/dL (ref 70–99)
POTASSIUM: 4.3 meq/L (ref 3.5–5.1)
Sodium: 139 mEq/L (ref 135–145)

## 2017-11-27 LAB — CBC WITH DIFFERENTIAL/PLATELET
BASOS ABS: 0 10*3/uL (ref 0.0–0.1)
Basophils Relative: 0.6 % (ref 0.0–3.0)
Eosinophils Absolute: 0 10*3/uL (ref 0.0–0.7)
Eosinophils Relative: 0.9 % (ref 0.0–5.0)
HCT: 35.2 % — ABNORMAL LOW (ref 36.0–46.0)
Hemoglobin: 11.6 g/dL — ABNORMAL LOW (ref 12.0–15.0)
LYMPHS ABS: 2.4 10*3/uL (ref 0.7–4.0)
Lymphocytes Relative: 43.4 % (ref 12.0–46.0)
MCHC: 32.9 g/dL (ref 30.0–36.0)
MCV: 83.7 fl (ref 78.0–100.0)
MONO ABS: 0.3 10*3/uL (ref 0.1–1.0)
Monocytes Relative: 5.4 % (ref 3.0–12.0)
NEUTROS PCT: 49.7 % (ref 43.0–77.0)
Neutro Abs: 2.8 10*3/uL (ref 1.4–7.7)
Platelets: 321 10*3/uL (ref 150.0–400.0)
RBC: 4.21 Mil/uL (ref 3.87–5.11)
RDW: 14.1 % (ref 11.5–15.5)
WBC: 5.6 10*3/uL (ref 4.0–10.5)

## 2017-11-27 LAB — TSH: TSH: 0.98 u[IU]/mL (ref 0.35–4.50)

## 2017-11-27 NOTE — Progress Notes (Signed)
   Subjective:    Patient ID: Shannon Anderson, female    DOB: 27-Dec-1985, 32 y.o.   MRN: [id number]  HPI Vaginal d/c- 'I feel like I have BV'.  Pt has been using Boric Acid as recommended by GYN.  + odor similar to previous episodes.  STD testing- pt's boyfriend cheated.  She wants to be tested 'for everything'.  Night sweats- pt will most nights that she will wake w/ sheets and pillow wet despite sleeping in 69 degree temperature w/ fan on and naked.  No recent changes to menstrual cycle.  No new meds or supplements.  No fevers or chills.  No cough or unexplained weight loss.  LMP 8/21.   Review of Systems For ROS see HPI     Objective:   Physical Exam  Constitutional: She is oriented to person, place, and time. She appears well-developed and well-nourished. No distress.  obese  HENT:  Head: Normocephalic and atraumatic.  Eyes: Pupils are equal, round, and reactive to light. Conjunctivae and EOM are normal.  Neck: Normal range of motion. Neck supple. Thyromegaly present.  Cardiovascular: Normal rate, regular rhythm, normal heart sounds and intact distal pulses.  No murmur heard. Pulmonary/Chest: Effort normal and breath sounds normal. No respiratory distress.  Abdominal: Soft. She exhibits no distension. There is no tenderness.  Musculoskeletal: She exhibits no edema.  Lymphadenopathy:    She has no cervical adenopathy.  Neurological: She is alert and oriented to person, place, and time.  Skin: Skin is warm and dry.  Psychiatric: She has a normal mood and affect. Her behavior is normal.  Vitals reviewed.         Assessment & Plan:  Vaginal d/c- pt is concerned for possible BV.  Hx of similar.  Has been using Boric Acid as prescribed by GYN but wants to 'make sure it's gone'.  Will hold on prescribing Flagyl until results available.  Pt expressed understanding and is in agreement w/ plan.   Possible STD exposure- new.  Pt recently found out her now ex-boyfriend was  cheating on her.  Currently asymptomatic.  Will check labs and determine treatment needed.  Night sweats- ongoing issue for months.  No recent changes in medications or menstrual cycle.  Occurs most nights.  No fevers, chills, cough, unexplained weight loss.  Check labs to assess for metabolic cause.  Discussed lifestyle and dietary modifications that could cause sweats.  Will follow.

## 2017-11-27 NOTE — Patient Instructions (Signed)
Follow up as needed or as scheduled We'll notify you of your lab results and make any changes if needed Continue to work on healthy diet and regular exercise- this helps to regulate your body temperature Avoid eating large meals or carbs before bed- this can also cause sweats Call with any questions or concerns Happy Labor Day!

## 2017-11-28 ENCOUNTER — Ambulatory Visit: Payer: No Typology Code available for payment source | Admitting: Family Medicine

## 2017-11-28 LAB — HIV ANTIBODY (ROUTINE TESTING W REFLEX): HIV: NONREACTIVE

## 2017-11-28 LAB — RPR: RPR Ser Ql: NONREACTIVE

## 2017-11-29 ENCOUNTER — Telehealth: Payer: Self-pay | Admitting: Family Medicine

## 2017-11-29 LAB — URINE CYTOLOGY ANCILLARY ONLY
Chlamydia: NEGATIVE
NEISSERIA GONORRHEA: NEGATIVE
TRICH (WINDOWPATH): NEGATIVE

## 2017-11-29 NOTE — Telephone Encounter (Unsigned)
Copied from CRM [id number]. Topic: Quick Communication - See Telephone Encounter >> Nov 29, 2017  9:33 AM Raquel SarnaHayes, Teresa G wrote: CRM for notification. See Telephone encounter for: 11/29/17.

## 2017-11-29 NOTE — Telephone Encounter (Signed)
Please clarify and update CRM.   AP

## 2017-11-30 LAB — URINE CYTOLOGY ANCILLARY ONLY: Candida vaginitis: NEGATIVE

## 2017-12-03 ENCOUNTER — Other Ambulatory Visit: Payer: Self-pay | Admitting: General Practice

## 2017-12-03 ENCOUNTER — Encounter: Payer: Self-pay | Admitting: Family Medicine

## 2017-12-03 MED ORDER — FLUCONAZOLE 150 MG PO TABS: 150.0000 mg | ORAL_TABLET | Freq: Once | ORAL | 0 refills | Status: AC

## 2017-12-03 MED ORDER — METRONIDAZOLE 500 MG PO TABS: 500.0000 mg | ORAL_TABLET | Freq: Three times a day (TID) | ORAL | 0 refills | Status: DC

## 2017-12-04 ENCOUNTER — Ambulatory Visit (INDEPENDENT_AMBULATORY_CARE_PROVIDER_SITE_OTHER): Payer: No Typology Code available for payment source | Admitting: Physician Assistant

## 2017-12-04 VITALS — BP 108/69 | HR 66 | Temp 98.1°F | Ht 67.0 in | Wt 218.0 lb

## 2017-12-04 DIAGNOSIS — E8881 Metabolic syndrome: Secondary | ICD-10-CM

## 2017-12-04 DIAGNOSIS — E669 Obesity, unspecified: Secondary | ICD-10-CM

## 2017-12-04 DIAGNOSIS — Z6834 Body mass index (BMI) 34.0-34.9, adult: Secondary | ICD-10-CM

## 2017-12-04 NOTE — Progress Notes (Signed)
Office: [phone]  /  Fax: [phone]   HPI:   Chief Complaint: OBESITY Shannon Anderson is here to discuss her progress with her obesity treatment plan. She is on the keep a food journal with 1300-1500 calories and 80 grams of protein daily and is following her eating plan approximately 70 % of the time. She states she is doing burn boot camp for 45 minutes 4 times per week. [name] reports that she has not gotten all of her calories in on some days. She has done better getting protein in.  Her weight is 218 lb (98.9 kg) today and has gained 3 pounds since her last visit. She has lost 2 lbs since starting treatment with [place].  Insulin Resistance Shannon Anderson has a diagnosis of insulin resistance based on her elevated fasting insulin level >5. Although Shannon Anderson's blood glucose readings are still under good control, insulin resistance puts her at greater risk of metabolic syndrome and diabetes. She is not taking metformin currently and continues to work on diet and exercise to decrease risk of diabetes. She denies polyphagia.  ALLERGIES: Allergies  Allergen Reactions  . Monistat 1 [Tioconazole] Swelling  . Other Other (See Comments)    Artificial sweetners - migraines MSG - migraines    MEDICATIONS: Current Outpatient Medications on File Prior to Visit  Medication Sig Dispense Refill  . Cholecalciferol (VITAMIN D3) 3000 units TABS Take by mouth.    . eletriptan (RELPAX) 40 MG tablet Take 1 tablet (40 mg total) by mouth as needed for migraine or headache. May repeat in 2 hours if headache persists or recurs. 10 tablet 3  . metroNIDAZOLE (FLAGYL) 500 MG tablet Take 1 tablet (500 mg total) by mouth 3 (three) times daily. 14 tablet 0  . Multiple Vitamin (MULTIVITAMIN) capsule Take 1 capsule by mouth daily.    . temazepam (RESTORIL) 7.5 MG capsule Take 1 capsule (7.5 mg total) by mouth at bedtime as needed for sleep. 30 capsule 1   No current facility-administered medications on file prior to visit.      PAST MEDICAL HISTORY: Past Medical History:  Diagnosis Date  . Depression   . Migraine   . Vitamin D deficiency     PAST SURGICAL HISTORY: No past surgical history on file.  SOCIAL HISTORY: Social History   Tobacco Use  . Smoking status: Never Smoker  . Smokeless tobacco: Never Used  Substance Use Topics  . Alcohol use: Yes    Comment: rarely  . Drug use: Never    FAMILY HISTORY: Family History  Problem Relation Age of Onset  . Hypertension Mother   . Cancer Mother 71       breast  . Breast cancer Mother   . Depression Mother   . Hypertension Father   . Diabetes Sister   . Cancer Maternal Aunt   . Breast cancer Maternal Aunt   . Stroke Maternal Grandmother   . Diabetes Paternal Grandmother   . Breast cancer Cousin     ROS: Review of Systems  Constitutional: Negative for weight loss.  Endo/Heme/Allergies:       Negative polyphagia    PHYSICAL EXAM: Blood pressure 108/69, pulse 66, temperature 98.1 F (36.7 C), temperature source Oral, height 5\' 7"  (1.702 m), weight 218 lb (98.9 kg), last menstrual period 11/20/2017, SpO2 99 %. Body mass index is 34.14 kg/m. Physical Exam  Constitutional: She is oriented to person, place, and time. She appears well-developed and well-nourished.  Cardiovascular: Normal rate.  Pulmonary/Chest: Effort normal.  Musculoskeletal:  Normal range of motion.  Neurological: She is oriented to person, place, and time.  Skin: Skin is warm and dry.  Psychiatric: She has a normal mood and affect. Her behavior is normal.  Vitals reviewed.   RECENT LABS AND TESTS: BMET    Component Value Date/Time   NA 139 11/27/2017 1000   NA 137 08/20/2017 1303   K 4.3 11/27/2017 1000   CL 105 11/27/2017 1000   CO2 26 11/27/2017 1000   GLUCOSE 89 11/27/2017 1000   BUN 13 11/27/2017 1000   BUN 7 08/20/2017 1303   CREATININE 0.94 11/27/2017 1000   CALCIUM 9.2 11/27/2017 1000   GFRNONAA 80 08/20/2017 1303   GFRAA 92 08/20/2017 1303    Lab Results  Component Value Date   HGBA1C 5.6 08/20/2017   Lab Results  Component Value Date   INSULIN 12.2 08/20/2017   CBC    Component Value Date/Time   WBC 5.6 11/27/2017 1000   RBC 4.21 11/27/2017 1000   HGB 11.6 (L) 11/27/2017 1000   HGB 12.0 08/20/2017 1303   HCT 35.2 (L) 11/27/2017 1000   HCT 36.9 08/20/2017 1303   PLT 321.0 11/27/2017 1000   MCV 83.7 11/27/2017 1000   MCV 84 08/20/2017 1303   MCH 27.2 08/20/2017 1303   MCHC 32.9 11/27/2017 1000   RDW 14.1 11/27/2017 1000   RDW 14.2 08/20/2017 1303   LYMPHSABS 2.4 11/27/2017 1000   LYMPHSABS 3.5 (H) 08/20/2017 1303   MONOABS 0.3 11/27/2017 1000   EOSABS 0.0 11/27/2017 1000   EOSABS 0.1 08/20/2017 1303   BASOSABS 0.0 11/27/2017 1000   BASOSABS 0.0 08/20/2017 1303   Iron/TIBC/Ferritin/ %Sat No results found for: IRON, TIBC, FERRITIN, IRONPCTSAT Lipid Panel     Component Value Date/Time   CHOL 172 08/20/2017 1303   TRIG 83 08/20/2017 1303   HDL 52 08/20/2017 1303   CHOLHDL 4 09/03/2016 0950   VLDL 14.8 09/03/2016 0950   LDLCALC 103 (H) 08/20/2017 1303   Hepatic Function Panel     Component Value Date/Time   PROT 6.8 08/20/2017 1303   ALBUMIN 4.0 08/20/2017 1303   AST 12 08/20/2017 1303   ALT 6 08/20/2017 1303   ALKPHOS 39 08/20/2017 1303   BILITOT 0.3 08/20/2017 1303   BILIDIR 0.1 09/03/2016 0950      Component Value Date/Time   TSH 0.98 11/27/2017 1000   TSH 1.340 08/20/2017 1303   TSH 1.64 09/03/2016 0950    ASSESSMENT AND PLAN: Insulin resistance  Class 1 obesity with serious comorbidity and body mass index (BMI) of 34.0 to 34.9 in adult, unspecified obesity type  PLAN:  Insulin Resistance [name] will continue to work on weight loss, diet, exercise, and decreasing simple carbohydrates in her diet to help decrease the risk of diabetes. We dicussed metformin including benefits and risks. She was informed that eating too many simple carbohydrates or too many calories at one sitting  increases the likelihood of GI side effects. [name] declined metformin for now and prescription was not written today. [name] agrees to follow up with our clinic in 2 to 3 weeks as directed to monitor her progress.  I spent > than 50% of the 15 minute visit on counseling as documented in the note.  Obesity Chrishelle is currently in the action stage of change. As such, her goal is to continue with weight loss efforts She has agreed to keep a food journal with 1300-1500 calories and 80 grams of protein daily Desarae has been instructed to  work up to a goal of 150 minutes of combined cardio and strengthening exercise per week for weight loss and overall health benefits. We discussed the following Behavioral Modification Strategies today: work on meal planning and easy cooking plans and ways to avoid boredom eating   Verdine has agreed to follow up with our clinic in 2 to 3 weeks. She was informed of the importance of frequent follow up visits to maximize her success with intensive lifestyle modifications for her multiple health conditions.   OBESITY BEHAVIORAL INTERVENTION VISIT  Today's visit was # 7.  Starting weight: 220 lbs Starting date: 08/20/17 Today's weight : 218 lbs Today's date: 12/04/2017 Total lbs lost to date: 2    ASK: We discussed the diagnosis of obesity with Lattie Haw today and Jovanna agreed to give [place] permission to discuss obesity behavioral modification therapy today.  ASSESS: Enaya has the diagnosis of obesity and her BMI today is 34.14 Julianna is in the action stage of change   ADVISE: Shanetha was educated on the multiple health risks of obesity as well as the benefit of weight loss to improve her health. She was advised of the need for long term treatment and the importance of lifestyle modifications.  AGREE: Multiple dietary modification options and treatment options were discussed and  Cassi agreed to the above obesity treatment plan.  Trude Mcburney, am acting as transcriptionist for Alois Cliche, PA-C I, Alois Cliche, PA-C have reviewed above note and agree with its content

## 2017-12-19 ENCOUNTER — Ambulatory Visit (INDEPENDENT_AMBULATORY_CARE_PROVIDER_SITE_OTHER): Payer: No Typology Code available for payment source | Admitting: Physician Assistant

## 2017-12-19 ENCOUNTER — Encounter (INDEPENDENT_AMBULATORY_CARE_PROVIDER_SITE_OTHER): Payer: Self-pay | Admitting: Physician Assistant

## 2017-12-19 VITALS — BP 98/66 | HR 63 | Temp 97.9°F | Ht 67.0 in | Wt 217.0 lb

## 2017-12-19 DIAGNOSIS — Z6834 Body mass index (BMI) 34.0-34.9, adult: Secondary | ICD-10-CM

## 2017-12-19 DIAGNOSIS — E8881 Metabolic syndrome: Secondary | ICD-10-CM | POA: Diagnosis not present

## 2017-12-19 DIAGNOSIS — E669 Obesity, unspecified: Secondary | ICD-10-CM

## 2017-12-23 NOTE — Progress Notes (Signed)
Office: [phone]  /  Fax: [phone]   HPI:   Chief Complaint: OBESITY Shannon Anderson is here to discuss her progress with her obesity treatment plan. She is on the keep a food journal with 1300-1500 calories and 80 grams of protein daily and is following her eating plan approximately 65 % of the time. She states she is doing boot camp for 60 minutes 4 times per week. Yazmyne did well with weight loss. She reports that [place] and attempted to make smart choices and practice portion control. She is frustrated with journaling.  Her weight is 217 lb (98.4 kg) today and has had a weight loss of 1 pound over a period of 2 weeks since her last visit. She has lost 3 lbs since starting treatment with us.  Insulin Resistance Ashby has a diagnosis of insulin resistance based on her elevated fasting insulin level >5. Although Amaia's blood glucose readings are still under good control, insulin resistance puts her at greater risk of metabolic syndrome and diabetes. She is not taking metformin currently and continues to work on diet and exercise to decrease risk of diabetes. She denies polyphagia.  ALLERGIES: Allergies  Allergen Reactions  . Monistat 1 [Tioconazole] Swelling  . Other Other (See Comments)    Artificial sweetners - migraines MSG - migraines    MEDICATIONS: Current Outpatient Medications on File Prior to Visit  Medication Sig Dispense Refill  . Cholecalciferol (VITAMIN D3) 3000 units TABS Take by mouth.    . eletriptan (RELPAX) 40 MG tablet Take 1 tablet (40 mg total) by mouth as needed for migraine or headache. May repeat in 2 hours if headache persists or recurs. 10 tablet 3  . metroNIDAZOLE (FLAGYL) 500 MG tablet Take 1 tablet (500 mg total) by mouth 3 (three) times daily. 14 tablet 0  . Multiple Vitamin (MULTIVITAMIN) capsule Take 1 capsule by mouth daily.    . temazepam (RESTORIL) 7.5 MG capsule Take 1 capsule (7.5 mg total) by mouth at bedtime as  needed for sleep. 30 capsule 1   No current facility-administered medications on file prior to visit.     PAST MEDICAL HISTORY: Past Medical History:  Diagnosis Date  . Depression   . Migraine   . Vitamin D deficiency     PAST SURGICAL HISTORY: No past surgical history on file.  SOCIAL HISTORY: Social History   Tobacco Use  . Smoking status: Never Smoker  . Smokeless tobacco: Never Used  Substance Use Topics  . Alcohol use: Yes    Comment: rarely  . Drug use: Never    FAMILY HISTORY: Family History  Problem Relation Age of Onset  . Hypertension Mother   . Cancer Mother [id number]       breast  . Breast cancer Mother   . Depression Mother   . Hypertension Father   . Diabetes Sister   . Cancer Maternal Aunt   . Breast cancer Maternal Aunt   . Stroke Maternal Grandmother   . Diabetes Paternal Grandmother   . Breast cancer Cousin     ROS: Review of Systems  Constitutional: Positive for weight loss.  Endo/Heme/Allergies:       Negative polyphagia    PHYSICAL EXAM: Blood pressure 98/66, pulse 63, temperature 97.9 F (36.6 C), temperature source Oral, height 5\' 7"  (1.702 m), weight 217 lb (98.4 kg), last menstrual period 12/17/2017, SpO2 100 %. Body mass index is 33.99 kg/m. Physical Exam  Constitutional: She is oriented to person, place,  and time. She appears well-developed and well-nourished.  Cardiovascular: Normal rate.  Pulmonary/Chest: Effort normal.  Musculoskeletal: Normal range of motion.  Neurological: She is oriented to person, place, and time.  Skin: Skin is warm and dry.  Psychiatric: She has a normal mood and affect. Her behavior is normal.  Vitals reviewed.   RECENT LABS AND TESTS: BMET    Component Value Date/Time   NA 139 11/27/2017 1000   NA 137 08/20/2017 1303   K 4.3 11/27/2017 1000   CL 105 11/27/2017 1000   CO2 26 11/27/2017 1000   GLUCOSE 89 11/27/2017 1000   BUN 13 11/27/2017 1000   BUN 7 08/20/2017 1303   CREATININE 0.94  11/27/2017 1000   CALCIUM 9.2 11/27/2017 1000   GFRNONAA 80 08/20/2017 1303   GFRAA 92 08/20/2017 1303   Lab Results  Component Value Date   HGBA1C 5.6 08/20/2017   Lab Results  Component Value Date   INSULIN 12.2 08/20/2017   CBC    Component Value Date/Time   WBC 5.6 11/27/2017 1000   RBC 4.21 11/27/2017 1000   HGB 11.6 (L) 11/27/2017 1000   HGB 12.0 08/20/2017 1303   HCT 35.2 (L) 11/27/2017 1000   HCT 36.9 08/20/2017 1303   PLT 321.0 11/27/2017 1000   MCV 83.7 11/27/2017 1000   MCV 84 08/20/2017 1303   MCH 27.2 08/20/2017 1303   MCHC 32.9 11/27/2017 1000   RDW 14.1 11/27/2017 1000   RDW 14.2 08/20/2017 1303   LYMPHSABS 2.4 11/27/2017 1000   LYMPHSABS 3.5 (H) 08/20/2017 1303   MONOABS 0.3 11/27/2017 1000   EOSABS 0.0 11/27/2017 1000   EOSABS 0.1 08/20/2017 1303   BASOSABS 0.0 11/27/2017 1000   BASOSABS 0.0 08/20/2017 1303   Iron/TIBC/Ferritin/ %Sat No results found for: IRON, TIBC, FERRITIN, IRONPCTSAT Lipid Panel     Component Value Date/Time   CHOL 172 08/20/2017 1303   TRIG 83 08/20/2017 1303   HDL 52 08/20/2017 1303   CHOLHDL 4 09/03/2016 0950   VLDL 14.8 09/03/2016 0950   LDLCALC 103 (H) 08/20/2017 1303   Hepatic Function Panel     Component Value Date/Time   PROT 6.8 08/20/2017 1303   ALBUMIN 4.0 08/20/2017 1303   AST 12 08/20/2017 1303   ALT 6 08/20/2017 1303   ALKPHOS 39 08/20/2017 1303   BILITOT 0.3 08/20/2017 1303   BILIDIR 0.1 09/03/2016 0950      Component Value Date/Time   TSH 0.98 11/27/2017 1000   TSH 1.340 08/20/2017 1303   TSH 1.64 09/03/2016 0950    ASSESSMENT AND PLAN: Insulin resistance  Class 1 obesity with serious comorbidity and body mass index (BMI) of 34.0 to 34.9 in adult, unspecified obesity type  PLAN:  Insulin Resistance Marialuisa will continue to work on weight loss, diet, exercise, and decreasing simple carbohydrates in her diet to help decrease the risk of diabetes. We dicussed metformin including benefits and  risks. She was informed that eating too many simple carbohydrates or too many calories at one sitting increases the likelihood of GI side effects. Loreli declined metformin for now and prescription was not written today. Taunya agrees to follow up with our clinic in 2 to 3 weeks as directed to monitor her progress.  I spent > than 50% of the 15 minute visit on counseling as documented in the note.  Obesity Evynn is currently in the action stage of change. As such, her goal is to continue with weight loss efforts She has agreed to follow  the Pescatarian eating plan Federica has been instructed to work up to a goal of 150 minutes of combined cardio and strengthening exercise per week for weight loss and overall health benefits. We discussed the following Behavioral Modification Strategies today: work on meal planning and easy cooking plans, keeping healthy foods in the home, and better snacking choices   Taneil has agreed to follow up with our clinic in 2 to 3 weeks. She was informed of the importance of frequent follow up visits to maximize her success with intensive lifestyle modifications for her multiple health conditions.   OBESITY BEHAVIORAL INTERVENTION VISIT  Today's visit was # 8   Starting weight: 220 lbs Starting date: 08/20/17 Today's weight : 217 lbs Today's date: 12/19/2017 Total lbs lost to date: 3    ASK: We discussed the diagnosis of obesity with Lattie Haw today and Markell agreed to give [place] permission to discuss obesity behavioral modification therapy today.  ASSESS: Staceyann has the diagnosis of obesity and her BMI today is 33.98 Demmi is in the action stage of change   ADVISE: Sarina was educated on the multiple health risks of obesity as well as the benefit of weight loss to improve her health. She was advised of the need for long term treatment and the importance of lifestyle modifications.  AGREE: Multiple dietary modification options and  treatment options were discussed and  Colletta agreed to the above obesity treatment plan.  Trude Mcburney, am acting as transcriptionist for Alois Cliche, PA-C I, Alois Cliche, PA-C have reviewed above note and agree with its content

## 2018-01-09 ENCOUNTER — Ambulatory Visit (INDEPENDENT_AMBULATORY_CARE_PROVIDER_SITE_OTHER): Payer: No Typology Code available for payment source | Admitting: Physician Assistant

## 2018-01-13 ENCOUNTER — Encounter: Payer: Self-pay | Admitting: Family Medicine

## 2018-01-13 MED ORDER — MODAFINIL 100 MG PO TABS: 100.0000 mg | ORAL_TABLET | Freq: Every day | ORAL | 1 refills | Status: DC

## 2018-01-24 ENCOUNTER — Telehealth: Payer: No Typology Code available for payment source | Admitting: Family

## 2018-01-24 ENCOUNTER — Ambulatory Visit: Payer: Self-pay

## 2018-01-24 DIAGNOSIS — R142 Eructation: Secondary | ICD-10-CM

## 2018-01-24 DIAGNOSIS — R1084 Generalized abdominal pain: Secondary | ICD-10-CM

## 2018-01-24 NOTE — Telephone Encounter (Signed)
Patient called in with c/o "abdominal pain." She says "a few nights ago, I ate a veggie bowl and after eating 1/2 of it, I felt bloated, was belching. Last night I belched and it came up in my throat and I had some heartburn. The pain is a 4 and is in the middle under the bra area down, constant pain. I had this problem abut 2 years ago, went to the ED, had an upper endoscopy done." I asked was she prescribed any medication at that time, she says "all I can remember is Carafate." I asked about other symptoms other than what is already mentioned, she denies. According to protocol, see PCP within 3 days, no availability with PCP, appointment scheduled for Wednesday, 01/29/18 at 0930 with Dr. Mardelle Matte, care advice given, patient verbalized understanding. Advised if Tums that she's taking is not working, she could try Mylanta, Pepcid for acid reflux, she verbalized understanding.  Reason for Disposition . [1] Abdominal pain is intermittent AND [2] shoots into chest, with sour taste in mouth  Answer Assessment - Initial Assessment Questions 1. LOCATION: "Where does it hurt?"      Mid under the bra area down 2. RADIATION: "Does the pain shoot anywhere else?" (e.g., chest, back)     No 3. ONSET: "When did the pain begin?" (e.g., minutes, hours or days ago)      A few nights ago after eating veggie bowl 4. SUDDEN: "Gradual or sudden onset?"     Sudden 5. PATTERN "Does the pain come and go, or is it constant?"    - If constant: "Is it getting better, staying the same, or worsening?"      (Note: Constant means the pain never goes away completely; most serious pain is constant and it progresses)     - If intermittent: "How long does it last?" "Do you have pain now?"     (Note: Intermittent means the pain goes away completely between bouts)     Constant 6. SEVERITY: "How bad is the pain?"  (e.g., Scale 1-10; mild, moderate, or severe)    - MILD (1-3): doesn't interfere with normal activities, abdomen soft and  not tender to touch     - MODERATE (4-7): interferes with normal activities or awakens from sleep, tender to touch     - SEVERE (8-10): excruciating pain, doubled over, unable to do any normal activities       4 7. RECURRENT SYMPTOM: "Have you ever had this type of abdominal pain before?" If so, ask: "When was the last time?" and "What happened that time?"      Yes, 2 years ago 8. AGGRAVATING FACTORS: "Does anything seem to cause this pain?" (e.g., foods, stress, alcohol)     Foods 9. CARDIAC SYMPTOMS: "Do you have any of the following symptoms: chest pain, difficulty breathing, sweating, nausea?"     No 10. OTHER SYMPTOMS: "Do you have any other symptoms?" (e.g., fever, vomiting, diarrhea)     Belching, heartburn, bloating 11. PREGNANCY: "Is there any chance you are pregnant?" "When was your last menstrual period?"     No; LMP this month  Protocols used: ABDOMINAL PAIN - UPPER-A-AH

## 2018-01-24 NOTE — Progress Notes (Signed)
Based on what you shared with me it looks like you have a serious condition that should be evaluated in a face to face office visit.  NOTE: If you entered your credit card information for this eVisit, you will not be charged. You may see a "hold" on your card for the $30 but that hold will drop off and you will not have a charge processed.  If you are having a true medical emergency please call 911.  If you need an urgent face to face visit, [facility] has four urgent care centers for your convenience.  If you need care fast and have a high deductible or no insurance consider:   [email or web address] to reserve your spot online an avoid wait times  [facility] [address], [address] [place], [place] [address] 8 am to 8 pm Monday-Friday 10 am to 4 pm Saturday-Sunday *Across the street from [employer]  [facility]  [address] [place], [address] 8 am to 5 pm Monday-Friday * In the [facility] the [facility]   The following sites will take your  insurance:  . [facility]  [phone] Get Driving Directions Find a Provider at this Location  [address] [place], [place] [address] . 10 am to 8 pm Monday-Friday . 12 pm to 8 pm Saturday-Sunday   . [facility]  [phone] Get Driving Directions Find a Provider at this Location  [address], [address] [place], [place] [address] . 8 am to 8 pm Monday-Friday . 9 am to 6 pm Saturday . 11 am to 6 pm Sunday   . [facility] [place]  [phone] Get Driving Directions  [address] [place], [place] [address] . 8 am to 8 pm Monday-Friday . 8 am to 4 pm Saturday-Sunday   Your e-visit answers were reviewed by a board certified advanced clinical practitioner to complete your personal care plan.  Thank you for using e-Visits.  

## 2018-01-29 ENCOUNTER — Encounter: Payer: Self-pay | Admitting: Family Medicine

## 2018-01-29 ENCOUNTER — Other Ambulatory Visit: Payer: Self-pay

## 2018-01-29 ENCOUNTER — Ambulatory Visit (INDEPENDENT_AMBULATORY_CARE_PROVIDER_SITE_OTHER): Payer: No Typology Code available for payment source | Admitting: Family Medicine

## 2018-01-29 VITALS — BP 114/74 | HR 90 | Temp 98.1°F | Ht 67.0 in | Wt 220.8 lb

## 2018-01-29 DIAGNOSIS — K219 Gastro-esophageal reflux disease without esophagitis: Secondary | ICD-10-CM

## 2018-01-29 DIAGNOSIS — K589 Irritable bowel syndrome without diarrhea: Secondary | ICD-10-CM | POA: Diagnosis not present

## 2018-01-29 MED ORDER — OMEPRAZOLE 20 MG PO CPDR: 20.0000 mg | DELAYED_RELEASE_CAPSULE | Freq: Every day | ORAL | 3 refills | Status: DC

## 2018-01-29 NOTE — Progress Notes (Signed)
Subjective  CC:  Chief Complaint  Patient presents with  . Abdominal Pain    upper abdominal pain with belching and heartburn x 1 week     HPI: Shannon Anderson is a 32 y.o. female who presents to the office today to address the problems listed above in the chief complaint. Same day acute visit; PCP not available. New pt to me. Chart reviewed.    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 sxs started abruptly after eating a veggie bowl at [employer].  She eats healthy working with [facility]. In spite of bland diet since, has daily sxs. Took tums w/o relief.   Reports 2 years ago had similar sxs: nl EGD and GI eval. Started vegan diet and it resolved.   Admits to long history of "sensitive" stomach.   ROS neg for melana, hematemesis, weight changes (unwanted), f/c/s, constipation or diarrhea.  Assessment  1. Gastroesophageal reflux disease, esophagitis presence not specified   2. Irritable bowel syndrome, unspecified type      Plan   GERD:  Most c/w GERD; start PPI 4-6 weeks. Educated.   May also have IBS. avs info given. Decrease dairy and return for further discussion if sxs of bloating persist.   Follow up: Return in about 4 weeks (around 02/26/2018), or if symptoms worsen or fail to improve, for recheck with Dr. Beverely Low.   No orders of the defined types were placed in this encounter.  Meds ordered this encounter  Medications  . omeprazole (PRILOSEC) 20 MG capsule    Sig: Take 1 capsule (20 mg total) by mouth daily.    Dispense:  30 capsule    Refill:  3      I reviewed the patients updated PMH, FH, and SocHx.    Patient Active Problem List   Diagnosis Date Noted  . Family history of breast cancer in mother 09/03/2016  . Depression 10/01/2014  . Physical exam 05/03/2014  . Thyromegaly 05/03/2014  . Insomnia 07/16/2013  . Migraine  with aura 04/30/2013  . Obesity (BMI 30-39.9) 04/30/2013   Current Meds  Medication Sig  . eletriptan (RELPAX) 40 MG tablet Take 1 tablet (40 mg total) by mouth as needed for migraine or headache. May repeat in 2 hours if headache persists or recurs.  . modafinil (PROVIGIL) 100 MG tablet Take 1 tablet (100 mg total) by mouth daily.  . Multiple Vitamin (MULTIVITAMIN) capsule Take 1 capsule by mouth daily.    Allergies: Patient is allergic to monistat 1 [tioconazole] and other. Family History: Patient family history includes Breast cancer in her cousin, maternal aunt, and mother; Cancer in her maternal aunt; Cancer (age of onset: 43) in her mother; Depression in her mother; Diabetes in her paternal grandmother and sister; Hypertension in her father and mother; Stroke in her maternal grandmother. Social History:  Patient  reports that she has never smoked. She has never used smokeless tobacco. She reports that she drinks alcohol. She reports that she does not use drugs.  Review of Systems: Constitutional: Negative for fever malaise or anorexia Cardiovascular: negative for chest pain Respiratory: negative for SOB or persistent cough Gastrointestinal: negative for abdominal pain  Objective  Vitals: BP 114/74   Pulse 90   Temp 98.1 F (36.7 C)   Ht 5\' 7"  (1.702 m)   Wt 220 lb 12.8 oz (100.2 kg)   BMI 34.58 kg/m  General: no acute distress , A&Ox3, appears well HEENT: PEERL, conjunctiva normal, Oropharynx moist,neck is supple Cardiovascular:  RRR without murmur or gallop.  Respiratory:  Good breath sounds bilaterally, CTAB with normal respiratory effort Gastrointestinal: soft, flat abdomen, normal active bowel sounds, no palpable masses, no hepatosplenomegaly, no appreciated hernias, mild mid-epigastric ttp w/o rebound or guarding Skin:  Warm, no rashes     Commons side effects, risks, benefits, and alternatives for medications and treatment plan prescribed today were discussed,  and the patient expressed understanding of the given instructions. Patient is instructed to call or message via MyChart if he/she has any questions or concerns regarding our treatment plan. No barriers to understanding were identified. We discussed Red Flag symptoms and signs in detail. Patient expressed understanding regarding what to do in case of urgent or emergency type symptoms.   Medication list was reconciled, printed and provided to the patient in AVS. Patient instructions and summary information was reviewed with the patient as documented in the AVS. This note was prepared with assistance of Dragon voice recognition software. Occasional wrong-word or sound-a-like substitutions may have occurred due to the inherent limitations of voice recognition software

## 2018-01-29 NOTE — Patient Instructions (Addendum)
Please follow up if symptoms do not improve or as needed.   FODMAP diet Irritable Bowel Syndrome, Adult Irritable bowel syndrome (IBS) is not one specific disease. It is a group of symptoms that affects the organs responsible for digestion (gastrointestinal or GI tract). To regulate how your GI tract works, your body sends signals back and forth between your intestines and your brain. If you have IBS, there may be a problem with these signals. As a result, your GI tract does not function normally. Your intestines may become more sensitive and overreact to certain things. This is especially true when you eat certain foods or when you are under stress. There are four types of IBS. These may be determined based on the consistency of your stool:  IBS with diarrhea.  IBS with constipation.  Mixed IBS.  Unsubtyped IBS.  It is important to know which type of IBS you have. Some treatments are more likely to be helpful for certain types of IBS. What are the causes? The exact cause of IBS is not known. What increases the risk? You may have a higher risk of IBS if:  You are a woman.  You are younger than 33 years old.  You have a family history of IBS.  You have mental health problems.  You have had bacterial infection of your GI tract.  What are the signs or symptoms? Symptoms of IBS vary from person to person. The main symptom is abdominal pain or discomfort. Additional symptoms usually include one or more of the following:  Diarrhea, constipation, or both.  Abdominal swelling or bloating.  Feeling full or sick after eating a small or regular-size meal.  Frequent gas.  Mucus in the stool.  A feeling of having more stool left after a bowel movement.  Symptoms tend to come and go. They may be associated with stress, psychiatric conditions, or nothing at all. How is this diagnosed? There is no specific test to diagnose IBS. Your health care provider will make a diagnosis based on  a physical exam, medical history, and your symptoms. You may have other tests to rule out other conditions that may be causing your symptoms. These may include:  Blood tests.  X-rays.  CT scan.  Endoscopy and colonoscopy. This is a test in which your GI tract is viewed with a long, thin, flexible tube.  How is this treated? There is no cure for IBS, but treatment can help relieve symptoms. IBS treatment often includes:  Changes to your diet, such as: ? Eating more fiber. ? Avoiding foods that cause symptoms. ? Drinking more water. ? Eating regular, medium-sized portioned meals.  Medicines. These may include: ? Fiber supplements if you have constipation. ? Medicine to control diarrhea (antidiarrheal medicines). ? Medicine to help control muscle spasms in your GI tract (antispasmodic medicines). ? Medicines to help with any mental health issues, such as antidepressants or tranquilizers.  Therapy. ? Talk therapy may help with anxiety, depression, or other mental health issues that can make IBS symptoms worse.  Stress reduction. ? Managing your stress can help keep symptoms under control.  Follow these instructions at home:  Take medicines only as directed by your health care provider.  Eat a healthy diet. ? Avoid foods and drinks with added sugar. ? Include more whole grains, fruits, and vegetables gradually into your diet. This may be especially helpful if you have IBS with constipation. ? Avoid any foods and drinks that make your symptoms worse. These may include dairy  products and caffeinated or carbonated drinks. ? Do not eat large meals. ? Drink enough fluid to keep your urine clear or pale yellow.  Exercise regularly. Ask your health care provider for recommendations of good activities for you.  Keep all follow-up visits as directed by your health care provider. This is important. Contact a health care provider if:  You have constant pain.  You have trouble or pain  with swallowing.  You have worsening diarrhea. Get help right away if:  You have severe and worsening abdominal pain.  You have diarrhea and: ? You have a rash, stiff neck, or severe headache. ? You are irritable, sleepy, or difficult to awaken. ? You are weak, dizzy, or extremely thirsty.  You have bright red blood in your stool or you have black tarry stools.  You have unusual abdominal swelling that is painful.  You vomit continuously.  You vomit blood (hematemesis).  You have both abdominal pain and a fever. This information is not intended to replace advice given to you by your health care provider. Make sure you discuss any questions you have with your health care provider. Document Released: 03/19/2005 Document Revised: 08/19/2015 Document Reviewed: 12/04/2013 Elsevier Interactive Patient Education  2018 [employer].

## 2018-02-03 ENCOUNTER — Encounter: Payer: Self-pay | Admitting: Family Medicine

## 2018-02-11 ENCOUNTER — Encounter: Payer: Self-pay | Admitting: Family Medicine

## 2018-02-12 MED ORDER — MODAFINIL 200 MG PO TABS: 200.0000 mg | ORAL_TABLET | Freq: Every day | ORAL | 2 refills | Status: DC

## 2018-02-21 ENCOUNTER — Other Ambulatory Visit: Payer: Self-pay

## 2018-02-21 ENCOUNTER — Encounter: Payer: Self-pay | Admitting: Family Medicine

## 2018-02-21 ENCOUNTER — Ambulatory Visit (INDEPENDENT_AMBULATORY_CARE_PROVIDER_SITE_OTHER): Payer: No Typology Code available for payment source | Admitting: Family Medicine

## 2018-02-21 VITALS — BP 115/78 | HR 83 | Temp 98.1°F | Resp 16 | Ht 67.0 in | Wt 217.5 lb

## 2018-02-21 DIAGNOSIS — K9049 Malabsorption due to intolerance, not elsewhere classified: Secondary | ICD-10-CM

## 2018-02-21 DIAGNOSIS — K589 Irritable bowel syndrome without diarrhea: Secondary | ICD-10-CM | POA: Diagnosis not present

## 2018-02-21 MED ORDER — DICYCLOMINE HCL 20 MG PO TABS: 20.0000 mg | ORAL_TABLET | Freq: Three times a day (TID) | ORAL | 1 refills | Status: DC | PRN

## 2018-02-21 NOTE — Assessment & Plan Note (Signed)
New to provider, ongoing for pt.  Reviewed FODMAP diet that is somewhat helpful for pt.  Pt has had GI workup previously- it was unrevealing.  Start Bentyl prn.  Reviewed supportive care and red flags that should prompt return.  Pt expressed understanding and is in agreement w/ plan.

## 2018-02-21 NOTE — Progress Notes (Signed)
   Subjective:    Patient ID: Shannon Anderson, female    DOB: March 28, 1986, 32 y.o.   MRN: [id number]  HPI abd issues- pt has been taking Prilosec regularly.  Has been following FODMAP diet w/ less discomfort.  Pt is able to eat seafood but has difficulty w/ other sources of protein.  + dairy intolerance.  Pt feels she is 'tip toeing around a lot of food'.  + gas, bloating, some cramping.  Denies reflux.  Intermittent nausea.  No vomiting.  Pt reports seeing GI 'a few years ago' and workup was normal.     Review of Systems For ROS see HPI     Objective:   Physical Exam  Constitutional: She is oriented to person, place, and time. She appears well-developed and well-nourished. No distress.  HENT:  Head: Normocephalic and atraumatic.  Abdominal: Soft. Bowel sounds are normal. She exhibits distension (mild). There is tenderness (mild). There is no guarding.  Neurological: She is alert and oriented to person, place, and time.  Skin: Skin is warm and dry.  Psychiatric: She has a normal mood and affect. Her behavior is normal. Thought content normal.  Vitals reviewed.         Assessment & Plan:  Food intolerance- new.  Pt reports she is not able to tolerate multiple foods and her variety has become very limited over time.  She is concerned that she is not meeting her nutritional needs based on dietary limitations.  Refer to Allergy for complete evaluation.  Will check Celiac Panel to determine if gluten is a problem for pt.  Pt expressed understanding and is in agreement w/ plan.

## 2018-02-21 NOTE — Patient Instructions (Signed)
Follow up as needed or as scheduled We'll notify you of your lab results and make any changes if needed Continue the Omeprazole USE the Dicyclomine as needed for abdominal discomfort We'll call you with your Allergy referral Call with any questions or concerns Hang in there! Happy Thanksgiving!

## 2018-02-25 LAB — TISSUE TRANSGLUTAMINASE, IGA: (tTG) Ab, IgA: 1 U/mL

## 2018-02-25 LAB — RETICULIN ANTIBODIES, IGA W TITER: RETICULIN IGA SCREEN: NEGATIVE

## 2018-02-25 LAB — GLIADIN ANTIBODIES, SERUM
Gliadin IgA: 6 Units
Gliadin IgG: 5 Units

## 2018-03-06 ENCOUNTER — Other Ambulatory Visit: Payer: Self-pay | Admitting: Emergency Medicine

## 2018-03-06 NOTE — Telephone Encounter (Signed)
Patient request a refill of the Temazepam 7.5 mg 1 cap at bedtime as needed for sleep Last rx 09/04/17 #30  Please advise

## 2018-03-07 MED ORDER — TEMAZEPAM 7.5 MG PO CAPS: 7.5000 mg | ORAL_CAPSULE | Freq: Every evening | ORAL | 3 refills | Status: DC | PRN

## 2018-03-07 NOTE — Telephone Encounter (Signed)
Ok for Temazepam 7.5mg  [facility] #30, 3 refills

## 2018-04-04 ENCOUNTER — Other Ambulatory Visit: Payer: Self-pay | Admitting: *Deleted

## 2018-04-04 ENCOUNTER — Encounter: Payer: Self-pay | Admitting: Family Medicine

## 2018-04-04 DIAGNOSIS — L989 Disorder of the skin and subcutaneous tissue, unspecified: Secondary | ICD-10-CM

## 2018-04-07 NOTE — Telephone Encounter (Signed)
Copied from CRM #[id number]. Topic: Referral - Status >> Apr 07, 2018  3:35 PM Walter, Linda F wrote: Reason for CRM:  [facility] stated they received a fax referral for the patient but only the first page came through.  They would like tfor the referral to be re-faxed. 

## 2018-04-07 NOTE — Telephone Encounter (Signed)
Angela re-faxing to the office.

## 2018-04-07 NOTE — Telephone Encounter (Signed)
Copied from CRM [id number]. Topic: Referral - Status >> Apr 07, 2018  3:35 PM Trula Slade wrote: Reason for CRM:  [facility] stated they received a fax referral for the patient but only the first page came through.  They would like tfor the referral to be re-faxed.

## 2018-04-17 ENCOUNTER — Encounter: Payer: Self-pay | Admitting: Family Medicine

## 2018-04-24 ENCOUNTER — Ambulatory Visit: Payer: No Typology Code available for payment source | Admitting: Allergy

## 2018-04-25 ENCOUNTER — Ambulatory Visit: Payer: No Typology Code available for payment source | Admitting: Family Medicine

## 2018-04-29 ENCOUNTER — Ambulatory Visit: Payer: No Typology Code available for payment source | Admitting: Allergy & Immunology

## 2018-05-22 ENCOUNTER — Ambulatory Visit: Payer: No Typology Code available for payment source | Admitting: Allergy

## 2018-05-23 MED FILL — OMEPRAZOLE 40 MG CPDR: 40 | 30 days supply | Qty: 30 | Fill #0

## 2018-05-26 ENCOUNTER — Encounter: Payer: Self-pay | Admitting: Allergy

## 2018-05-26 ENCOUNTER — Ambulatory Visit (INDEPENDENT_AMBULATORY_CARE_PROVIDER_SITE_OTHER): Payer: No Typology Code available for payment source | Admitting: Allergy

## 2018-05-26 VITALS — BP 127/80 | HR 71 | Resp 16 | Ht 67.0 in | Wt 221.0 lb

## 2018-05-26 DIAGNOSIS — K219 Gastro-esophageal reflux disease without esophagitis: Secondary | ICD-10-CM

## 2018-05-26 DIAGNOSIS — R1084 Generalized abdominal pain: Secondary | ICD-10-CM | POA: Diagnosis not present

## 2018-05-26 DIAGNOSIS — T781XXD Other adverse food reactions, not elsewhere classified, subsequent encounter: Secondary | ICD-10-CM | POA: Diagnosis not present

## 2018-05-26 DIAGNOSIS — K9049 Malabsorption due to intolerance, not elsewhere classified: Secondary | ICD-10-CM | POA: Diagnosis not present

## 2018-05-26 DIAGNOSIS — T781XXA Other adverse food reactions, not elsewhere classified, initial encounter: Secondary | ICD-10-CM | POA: Insufficient documentation

## 2018-05-26 MED ORDER — FAMOTIDINE 20 MG PO TABS: 20.0000 mg | ORAL_TABLET | Freq: Two times a day (BID) | ORAL | 5 refills | Status: DC

## 2018-05-26 MED ORDER — FAMOTIDINE 40 MG PO TABS: 40.0000 mg | ORAL_TABLET | Freq: Every day | ORAL | 5 refills | Status: DC

## 2018-05-26 NOTE — Assessment & Plan Note (Addendum)
3 year history of generalized abdominal pain, gas and bloating. Tried various OTC medications with no relief. GI evaluation was normal in 2017 with normal EGD and negative H. Pylori. No specific food triggers noted but usually occur within 30 minutes of eating. Now back on Prilosec 40mg  daily. She also underwent a gastric balloon placement a few weeks ago for weight loss purposes. Patient concerned about potential food allergies/intolerances.  Today's skin testing was negative to the food panel.   Discussed with patient that based on clinical history she does not have the typical IgE mediated symptoms which is what skin prick testing tests for. To rule out food intolerances, I advised her to keep a food journal along with symptoms. If there is no correlation with certain types of food and symptoms persists then recommend GI referral for repeat EGD and/or colonoscopy.

## 2018-05-26 NOTE — Assessment & Plan Note (Signed)
   Continue with reflux diet.  Continue Prilosec 40mg  daily and may increase to BID if it helps symptoms.  Add Pepcid 20mg  daily and may increase to 20mg  BID if it helps.

## 2018-05-26 NOTE — Patient Instructions (Addendum)
Today's skin testing showed: Negative to foods.  Keep food journal and journal of symptoms.  Continue with reflux diet. Continue Prilosec 40mg  daily. Add Pepcid 20-40mg  daily.  Follow up with GI regarding your symptoms.   Follow up as needed.

## 2018-05-26 NOTE — Progress Notes (Signed)
New Patient Note  RE: [name] MRN: [id number] DOB: 06-28-1985 Date of Office Visit: 05/26/2018  Referring provider: Sheliah Hatch, MD Primary care provider: Sheliah Hatch, MD  Chief Complaint: Food Intolerance (unsure what types but had a smoothie from [employer] )  History of Present Illness: I had the pleasure of seeing Shannon Anderson for initial evaluation at the [facility] on 05/26/2018. She is a 33 y.o. female, who is referred here by Sheliah Hatch, MD for the evaluation of food intolerance.  In 2017 patient started to have abdominal issues with discomfort, gas and bloating. She tried OTC medications with no relief. She went to urgent care and was diagnosed with gastritis.  She went to see GI and had EGD done which was normal in 2017. Currently avoiding caffeinated foods, spicy foods and processed foods.  She tried vegan diet and was doing better for about 1 year. A few months ago the abdominal pains returned. The only thing that would help is laying down and resting.  She is okay with french fries and smoothies.  She also tried Bentyl with some benefit. Patient is not on vegan diet anymore and eats seafood and egg, chicken.   Symptoms usually occur within 30 minutes of eating. 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  Currently on Prilosec  daily and just started a few weeks ago. She did take BID dosing during flare with some benefit.   Patient had a gastric balloon placed a few weeks ago for weight loss purposes.   Past work up includes: no previous allergy testing.  Dietary History: patient has been eating other foods including limited dairy, eggs, peanut, treenuts, sesame, shellfish, seafood, soy, wheat, chicken, fruits and vegetables.  Assessment and Plan: Shannon Anderson is a 33 y.o. female with: Generalized abdominal pain 3 year history of generalized abdominal pain, gas and bloating. Tried various  OTC medications with no relief. GI evaluation was normal in 2017 with normal EGD and negative H. Pylori. No specific food triggers noted but usually occur within 30 minutes of eating. Now back on Prilosec  daily. She also underwent a gastric balloon placement a few weeks ago for weight loss purposes. Patient concerned about potential food allergies/intolerances.  Today's skin testing was negative to the food panel.   Discussed with patient that based on clinical history she does not have the typical IgE mediated symptoms which is what skin prick testing tests for. To rule out food intolerances, I advised her to keep a food journal along with symptoms. If there is no correlation with certain types of food and symptoms persists then recommend GI referral for repeat EGD and/or colonoscopy.  Gastroesophageal reflux disease  Continue with reflux diet.  Continue Prilosec  daily and may increase to BID if it helps symptoms.  Add Pepcid  daily and may increase to  BID if it helps.   Return if symptoms worsen or fail to improve.  Meds ordered this encounter  Medications  . famotidine (PEPCID) 20 MG tablet    Sig: Take 1 tablet (20 mg total) by mouth 2 (two) times daily.    Dispense:  30 tablet    Refill:  5    Please cancel  dose. Thank you.   Other allergy screening: Asthma: no  As a child and no recent inhaler use. Rhino conjunctivitis: no Medication allergy: yes  Monistat - vaginal swelling Hymenoptera allergy: no Urticaria: no  One episode of facial hives after eating chicken nuggets and ketchup about 8 years ago. Had same foods with no issues since then.  Eczema:no History of recurrent infections suggestive of immunodeficency: no  Diagnostics: Skin Testing: Food allergy panel. Negative test to: food panel.  Results discussed with patient/family. Food Adult Perc - 05/26/18 0900    Time Antigen Placed  5361    Allergen Manufacturer  Waynette Buttery    Location  Back     Number of allergen test  74    Panel 2  Select     Control-buffer 50% Glycerol  Negative    Control-Histamine 1 mg/ml  --   1+/-   1. Peanut  Negative    2. Soybean  Negative    3. Wheat  Negative    4. Sesame  Negative    5. Milk, cow  Negative    6. Egg White, Chicken  Negative    7. Casein  Negative    8. Shellfish Mix  Negative    9. Fish Mix  Negative    10. Cashew  Negative    11. Pecan Food  Negative    12. Walnut Food  Negative    13. Almond  Negative    14. Hazelnut  Negative    15. [place] nut  Negative    16. Coconut  Negative    17. Pistachio  Negative    18. Catfish  Negative    19. Bass  Negative    20. Trout  Negative    21. Tuna  Negative    22. Salmon  Negative    23. Flounder  Negative    24. Codfish  Negative    25. Shrimp  Negative    26. Crab  Negative    27. Lobster  Negative    28. Oyster  Negative    29. Scallops  Negative    30. Barley  Negative    31. Oat   Negative    32. Rye   Negative    33. Hops  Negative    34. Rice  Negative    35. Cottonseed  Negative    36. Saccharomyces Cerevisiae   Negative    37. Pork  Negative    38. [place] Meat  Negative    39. Chicken Meat  Negative    40. Beef  Negative    41. Lamb  Negative    42. Tomato  Negative    43. White Potato  Negative    44. Sweet Potato  Negative    45. Pea, Green/English  Negative    46. Navy Bean  Negative    47. Mushrooms  Negative    48. Avocado  Negative    49. Onion  Negative    50. Cabbage  Negative    51. Carrots  Negative    52. Celery  Negative    53. Corn  Negative    54. Cucumber  Negative    55. Grape (White seedless)  Negative    56. Orange   Negative    57. Banana  Negative    58. Apple  Negative    59. Peach  Negative    60. Strawberry  Negative    61. Cantaloupe  Negative    62. Watermelon  Negative    63. Pineapple  Negative    64. Chocolate/Cacao bean  Negative    65. Karaya Gum  Negative    66. Acacia (Arabic Gum)  Negative  67. Cinnamon   Negative    68. Nutmeg  Negative    69. Ginger  Negative    70. Garlic  Negative    71. Pepper, black  Negative    72. Mustard  Negative       Past Medical History: Patient Active Problem List   Diagnosis Date Noted  . Food intolerance 05/26/2018  . Adverse food reaction 05/26/2018  . Generalized abdominal pain 05/26/2018  . Irritable bowel syndrome 02/21/2018  . Family history of breast cancer in mother 09/03/2016  . Gastroesophageal reflux disease 10/24/2015  . Physical exam 05/03/2014  . Thyromegaly 05/03/2014  . Insomnia 07/16/2013  . Migraine with aura 04/30/2013  . Obesity (BMI 30-39.9) 04/30/2013   Past Medical History:  Diagnosis Date  . Depression   . Migraine   . Vitamin D deficiency    Past Surgical History: Past Surgical History:  Procedure Laterality Date  . OTHER SURGICAL HISTORY     Gastric Balloon   Medication List:  Current Outpatient Medications  Medication Sig Dispense Refill  . dicyclomine (BENTYL) 20 MG tablet Take 1 tablet (20 mg total) by mouth 3 (three) times daily as needed for spasms. 90 tablet 1  . eletriptan (RELPAX) 40 MG tablet Take 1 tablet (40 mg total) by mouth as needed for migraine or headache. May repeat in 2 hours if headache persists or recurs. 10 tablet 3  . Multiple Vitamin (MULTIVITAMIN) capsule Take 1 capsule by mouth daily.    Marland Kitchen omeprazole (PRILOSEC) 40 MG capsule Take 40 mg by mouth daily.    . temazepam (RESTORIL) 7.5 MG capsule Take 1 capsule (7.5 mg total) by mouth at bedtime as needed for sleep. 30 capsule 3  . famotidine (PEPCID) 20 MG tablet Take 1 tablet (20 mg total) by mouth 2 (two) times daily. 30 tablet 5  . modafinil (PROVIGIL) 200 MG tablet Take 1 tablet (200 mg total) by mouth daily. (Patient not taking: Reported on 05/26/2018) 30 tablet 2   No current facility-administered medications for this visit.    Allergies: Allergies  Allergen Reactions  . Monistat 1 [Tioconazole] Swelling  . Other Other (See  Comments)    Artificial sweetners - migraines MSG - migraines   Social History: Social History   Socioeconomic History  . Marital status: Single    Spouse name: Not on file  . Number of children: 1  . Years of education: Not on file  . Highest education level: Not on file  Occupational History  . Not on file  Social Needs  . Financial resource strain: Not on file  . Food insecurity:    Worry: Not on file    Inability: Not on file  . Transportation needs:    Medical: Not on file    Non-medical: Not on file  Tobacco Use  . Smoking status: Never Smoker  . Smokeless tobacco: Never Used  Substance and Sexual Activity  . Alcohol use: Yes    Comment: rarely  . Drug use: Never  . Sexual activity: Yes  Lifestyle  . Physical activity:    Days per week: Not on file    Minutes per session: Not on file  . Stress: Not on file  Relationships  . Social connections:    Talks on phone: Not on file    Gets together: Not on file    Attends religious service: Not on file    Active member of club or organization: Not on file    Attends meetings  of clubs or organizations: Not on file    Relationship status: Not on file  Other Topics Concern  . Not on file  Social History Narrative  . Not on file   Lives in a condo built in the 1990s. Smoking: denies Occupation: Audiological scientist HistorySurveyor, minerals in the house: no Carpet in the family room: no Carpet in the bedroom: yes Heating: electric Cooling: central Pet: no  Family History: Family History  Problem Relation Age of Onset  . Hypertension Mother   . Cancer Mother 35       breast  . Breast cancer Mother   . Depression Mother   . Hypertension Father   . Diabetes Sister   . Cancer Maternal Aunt   . Breast cancer Maternal Aunt   . Stroke Maternal Grandmother   . Diabetes Paternal Grandmother   . Breast cancer Cousin   . Breast cancer Sister    Problem                               Relation Asthma                                    Niece  Eczema                                Niece Food allergy                          No  Allergic rhino conjunctivitis     Son   Review of Systems  Constitutional: Negative for appetite change, chills, fever and unexpected weight change.  HENT: Negative for congestion and rhinorrhea.   Eyes: Negative for itching.  Respiratory: Negative for cough, chest tightness, shortness of breath and wheezing.   Cardiovascular: Negative for chest pain.  Gastrointestinal: Positive for abdominal pain and nausea. Negative for constipation, diarrhea and vomiting.  Genitourinary: Negative for difficulty urinating.  Skin: Negative for rash.  Allergic/Immunologic: Negative for environmental allergies and food allergies.  Neurological: Negative for headaches.   Objective: BP 127/80   Pulse 71   Resp 16   Ht  (1.702 m)   Wt 221 lb (100.2 kg)   SpO2 98%   BMI 34.61 kg/m  Body mass index is 34.61 kg/m. Physical Exam  Constitutional: She is oriented to person, place, and time. She appears well-developed and well-nourished.  HENT:  Head: Normocephalic and atraumatic.  Right Ear: External ear normal.  Left Ear: External ear normal.  Nose: Nose normal.  Mouth/Throat: Oropharynx is clear and moist.  Eyes: Conjunctivae and EOM are normal.  Neck: Neck supple.  Cardiovascular: Normal rate, regular rhythm and normal heart sounds. Exam reveals no gallop and no friction rub.  No murmur heard. Pulmonary/Chest: Effort normal and breath sounds normal. She has no wheezes. She has no rales.  Abdominal: Soft. Bowel sounds are normal. There is no abdominal tenderness.  Lymphadenopathy:    She has no cervical adenopathy.  Neurological: She is alert and oriented to person, place, and time.  Skin: Skin is warm. No rash noted.  Psychiatric: She has a normal mood and affect. Her behavior is normal.  Nursing note and vitals reviewed.  The plan was reviewed with the  patient/family, and all  questions/concerned were addressed.  It was my pleasure to see Kaydin today and participate in her care. Please feel free to contact me with any questions or concerns.  Sincerely,  Wyline Mood, DO Allergy & Immunology  [facility] office: [phone] [facility] office: [phone]

## 2018-06-18 MED FILL — OMEPRAZOLE 40 MG CPDR: 40 | 30 days supply | Qty: 30 | Fill #1

## 2018-07-04 MED FILL — FAMOTIDINE 40 MG TABLET: 40 | 30 days supply | Qty: 30 | Fill #0

## 2018-07-15 MED FILL — TRANEXAMIC ACID 650 MG TAB: 650 | 5 days supply | Qty: 30 | Fill #0

## 2018-07-17 ENCOUNTER — Encounter: Payer: Self-pay | Admitting: Family Medicine

## 2018-07-18 ENCOUNTER — Ambulatory Visit (INDEPENDENT_AMBULATORY_CARE_PROVIDER_SITE_OTHER): Payer: No Typology Code available for payment source | Admitting: Family Medicine

## 2018-07-18 ENCOUNTER — Encounter: Payer: Self-pay | Admitting: Family Medicine

## 2018-07-18 ENCOUNTER — Other Ambulatory Visit: Payer: Self-pay

## 2018-07-18 VITALS — Temp 97.7°F | Ht 67.0 in | Wt 209.0 lb

## 2018-07-18 DIAGNOSIS — G43109 Migraine with aura, not intractable, without status migrainosus: Secondary | ICD-10-CM

## 2018-07-18 MED ORDER — DICYCLOMINE HCL 20 MG PO TABS: 20.0000 mg | ORAL_TABLET | Freq: Three times a day (TID) | ORAL | 1 refills | Status: DC | PRN

## 2018-07-18 MED ORDER — PROPRANOLOL HCL ER 60 MG PO CP24: 60.0000 mg | ORAL_CAPSULE | Freq: Every day | ORAL | 3 refills | Status: DC

## 2018-07-18 MED ORDER — RIZATRIPTAN BENZOATE 10 MG PO TABS: 20.0000 mg | ORAL_TABLET | ORAL | 6 refills | Status: DC | PRN

## 2018-07-18 MED FILL — RIZATRIPTAN BENZOATE 10 MG: 10 | 30 days supply | Qty: 18 | Fill #0

## 2018-07-18 MED FILL — DICYCLOMINE 20 MG TABLET: 20 | 30 days supply | Qty: 90 | Fill #0

## 2018-07-18 MED FILL — PROPRANOLOL HCL ER 60 MG CP: 60 | 30 days supply | Qty: 30 | Fill #0

## 2018-07-18 NOTE — Progress Notes (Signed)
I have discussed the procedure for the virtual visit with the patient who has given consent to proceed with assessment and treatment.   Shannon Anderson, CMA     

## 2018-07-18 NOTE — Progress Notes (Signed)
Virtual Visit via Video   I connected with patient on 07/18/18 at  9:40 AM EDT by a video enabled telemedicine application and verified that I am speaking with the correct person using two identifiers.  Location patient: [place] [facility] [place] Persons participating in the virtual visit: Patient, Provider, CMA [facility](Bethany Dillard)  I discussed the limitations of evaluation and management by telemedicine and the availability of in person appointments. The patient expressed understanding and agreed to proceed.  Subjective:   HPI:   Migraines- pt reports increased frequency of migraines over the last few months.  She was taking her Relpax but needed to add the Maxalt.  She will occasionally have auras- will take medicine at that time if present.  Has had to call out of work due to [employer].  Denies N/V.  + photo and phonophobia.  HAs are typically R sided.  Relpax ineffective, previously Imitrex ineffective.  Having at least 2-3 HAs/week.  ROS:   See pertinent positives and negatives per HPI.  Patient Active Problem List   Diagnosis Date Noted  . Food intolerance 05/26/2018  . Adverse food reaction 05/26/2018  . Generalized abdominal pain 05/26/2018  . Irritable bowel syndrome 02/21/2018  . Family history of breast cancer in mother 09/03/2016  . Gastroesophageal reflux disease 10/24/2015  . Physical exam 05/03/2014  . Thyromegaly 05/03/2014  . Insomnia 07/16/2013  . Migraine with aura 04/30/2013  . Obesity (BMI 30-39.9) 04/30/2013    Social History   Tobacco Use  . Smoking status: Never Smoker  . Smokeless tobacco: Never Used  Substance Use Topics  . Alcohol use: Yes    Comment: rarely    Current Outpatient Medications:  .  dicyclomine (BENTYL) 20 MG tablet, Take 1 tablet (20 mg total) by mouth 3 (three) times daily as needed for spasms., Disp: 90 tablet, Rfl: 1 .  eletriptan (RELPAX) 40 MG tablet, Take 1 tablet (40 mg total) by mouth as needed for  migraine or headache. May repeat in 2 hours if headache persists or recurs., Disp: 10 tablet, Rfl: 3 .  famotidine (PEPCID) 20 MG tablet, Take 1 tablet (20 mg total) by mouth 2 (two) times daily., Disp: 30 tablet, Rfl: 5 .  Multiple Vitamin (MULTIVITAMIN) capsule, Take 1 capsule by mouth daily., Disp: , Rfl:  .  omeprazole (PRILOSEC) 40 MG capsule, Take 40 mg by mouth daily., Disp: , Rfl:  .  rizatriptan (MAXALT) 10 MG tablet, Take 10 mg by mouth as needed for migraine. May repeat in 2 hours if needed, Disp: , Rfl:  .  temazepam (RESTORIL) 7.5 MG capsule, Take 1 capsule (7.5 mg total) by mouth at bedtime as needed for sleep., Disp: 30 capsule, Rfl: 3 .  modafinil (PROVIGIL) 200 MG tablet, Take 1 tablet (200 mg total) by mouth daily. (Patient not taking: Reported on 05/26/2018), Disp: 30 tablet, Rfl: 2  Allergies  Allergen Reactions  . Monistat 1 [Tioconazole] Swelling  . Other Other (See Comments)    Artificial sweetners - migraines MSG - migraines    Objective:   Temp 97.7 F (36.5 C)   Ht 5\' 7"  (1.702 m)   Wt 209 lb (94.8 kg)   BMI 32.73 kg/m   AAOx3, NAD NCAT, EOMI No obvious CN deficits Coloring WNL Pt is able to speak clearly, coherently without shortness of breath or increased work of breathing.  Thought process is linear.  Mood is appropriate.   Assessment and Plan:   Migraine- deteriorated.  Pt reports  sxs are much more frequent than previously.  She was taking 2 triptans and still struggling w/ sxs.  Will at times take 30mg  Maxalt- cautioned her that this is max dose.  Discussed taking tylenol and drinking caffeine at onset of HA.  Given the increased frequency, will start daily beta blocker.  Pt is to keep a HA log to review when we f/u in 1 month.  Pt expressed understanding and is in agreement w/ plan.    Neena Rhymes, MD 07/18/2018

## 2018-07-28 MED FILL — TEMAZEPAM 7.5 MG CAPSULE: 7.5 | 30 days supply | Qty: 30 | Fill #0

## 2018-07-28 MED FILL — OMEPRAZOLE 40 MG CPDR: 40 | 30 days supply | Qty: 30 | Fill #2

## 2018-07-30 ENCOUNTER — Other Ambulatory Visit: Payer: Self-pay | Admitting: Obstetrics & Gynecology

## 2018-07-30 DIAGNOSIS — Z9189 Other specified personal risk factors, not elsewhere classified: Secondary | ICD-10-CM

## 2018-08-07 ENCOUNTER — Other Ambulatory Visit: Payer: No Typology Code available for payment source

## 2018-08-12 ENCOUNTER — Telehealth: Payer: Self-pay | Admitting: Family Medicine

## 2018-08-12 NOTE — Telephone Encounter (Signed)
I have placed a pre-employment clearance form in the bin upfront with a charge sheet.

## 2018-08-13 MED FILL — PROPRANOLOL HCL ER 60 MG CP: 60 | 30 days supply | Qty: 30 | Fill #1

## 2018-08-13 NOTE — Telephone Encounter (Signed)
Paperwork given to PCP for signature.  

## 2018-08-13 NOTE — Telephone Encounter (Signed)
Picked up from back, faxed to the # on the form and sent to scan.

## 2018-08-13 NOTE — Telephone Encounter (Signed)
fyi

## 2018-08-13 NOTE — Telephone Encounter (Signed)
Form completed and placed in basket  

## 2018-08-18 ENCOUNTER — Encounter: Payer: Self-pay | Admitting: Family Medicine

## 2018-08-18 ENCOUNTER — Ambulatory Visit (INDEPENDENT_AMBULATORY_CARE_PROVIDER_SITE_OTHER): Payer: No Typology Code available for payment source | Admitting: Family Medicine

## 2018-08-18 ENCOUNTER — Other Ambulatory Visit: Payer: Self-pay

## 2018-08-18 VITALS — BP 118/62 | HR 67 | Temp 99.2°F | Ht 67.0 in | Wt 214.0 lb

## 2018-08-18 DIAGNOSIS — G43109 Migraine with aura, not intractable, without status migrainosus: Secondary | ICD-10-CM | POA: Diagnosis not present

## 2018-08-18 DIAGNOSIS — G4700 Insomnia, unspecified: Secondary | ICD-10-CM | POA: Diagnosis not present

## 2018-08-18 MED ORDER — ZOLPIDEM TARTRATE 5 MG PO TABS: 5.0000 mg | ORAL_TABLET | Freq: Every evening | ORAL | 3 refills | Status: DC | PRN

## 2018-08-18 NOTE — Progress Notes (Signed)
Virtual Visit via Video   I connected with patient on 08/18/18 at  8:00 AM EDT by a video enabled telemedicine application and verified that I am speaking with the correct person using two identifiers.  Location patient: Home Location provider: [facility], Office Persons participating in the virtual visit: Patient, Provider, CMA (Jess B)  I discussed the limitations of evaluation and management by telemedicine and the availability of in person appointments. The patient expressed understanding and agreed to proceed.  Subjective:   HPI:   Migraines- chronic problem, was started on Propranolol 60mg  daily as a prophylactic.  Switched to Maxalt for rescue medication.  Pt feels that Propranolol is helping- down to 6 HAs per month rather than 2-3/week.  Maxalt provides better relief than previous medications.  Insomnia- ongoing issue for pt.  On Temazepam 7.5mg  nightly.  Reports she is only getting ~5 hrs of sleep.  Difficulty sleeping even on days off (pt works nights)  Pt googled max dose of Restoril and still didn't sleep.  Has been on Trazodone in the past w/o results.  Asking to try Ambien.  ROS:   See pertinent positives and negatives per HPI.  Patient Active Problem List   Diagnosis Date Noted  . Food intolerance 05/26/2018  . Adverse food reaction 05/26/2018  . Generalized abdominal pain 05/26/2018  . Irritable bowel syndrome 02/21/2018  . Family history of breast cancer in mother 09/03/2016  . Gastroesophageal reflux disease 10/24/2015  . Physical exam 05/03/2014  . Thyromegaly 05/03/2014  . Insomnia 07/16/2013  . Migraine with aura 04/30/2013  . Obesity (BMI 30-39.9) 04/30/2013    Social History   Tobacco Use  . Smoking status: Never Smoker  . Smokeless tobacco: Never Used  Substance Use Topics  . Alcohol use: Yes    Comment: rarely    Current Outpatient Medications:  .  dicyclomine (BENTYL) 20 MG tablet, Take 1 tablet (20 mg total) by mouth 3 (three)  times daily as needed for spasms., Disp: 90 tablet, Rfl: 1 .  famotidine (PEPCID) 20 MG tablet, Take 1 tablet (20 mg total) by mouth 2 (two) times daily., Disp: 30 tablet, Rfl: 5 .  Multiple Vitamin (MULTIVITAMIN) capsule, Take 1 capsule by mouth daily., Disp: , Rfl:  .  omeprazole (PRILOSEC) 40 MG capsule, Take 40 mg by mouth daily., Disp: , Rfl:  .  propranolol ER (INDERAL LA) 60 MG 24 hr capsule, Take 1 capsule (60 mg total) by mouth daily., Disp: 30 capsule, Rfl: 3 .  rizatriptan (MAXALT) 10 MG tablet, Take 2 tablets (20 mg total) by mouth as needed for migraine. May repeat in 2 hours if needed, Disp: 20 tablet, Rfl: 6 .  temazepam (RESTORIL) 7.5 MG capsule, Take 1 capsule (7.5 mg total) by mouth at bedtime as needed for sleep., Disp: 30 capsule, Rfl: 3 .  modafinil (PROVIGIL) 200 MG tablet, Take 1 tablet (200 mg total) by mouth daily. (Patient not taking: Reported on 05/26/2018), Disp: 30 tablet, Rfl: 2  Allergies  Allergen Reactions  . Monistat 1 [Tioconazole] Swelling  . Other Other (See Comments)    Artificial sweetners - migraines MSG - migraines    Objective:   BP 118/62   Pulse 67   Temp 99.2 F (37.3 C) (Oral)   Ht 5\' 7"  (1.702 m)   Wt 214 lb (97.1 kg)   BMI 33.52 kg/m   AAOx3, NAD NCAT, EOMI No obvious CN deficits Coloring WNL Pt is able to speak clearly, coherently without shortness of  breath or increased work of breathing.  Thought process is linear.  Mood is appropriate.   Assessment and Plan:   Migraines- chronic problem, improved w/ addition of Propranolol.  Maxalt works better as a Doctor, hospitalrescue med than Relpax or Imitrex did in the past.  She feels sxs will improve even more once she is sleeping better.  Insomnia- chronic problem, she has tried multiple medications in the past (both OTC and prescription).  Start low dose Ambien and monitor for improvement.  Pt was cautioned on the risks of Ambien- including sleep walking/cooking/driving, etc.   Neena RhymesKatherine Caramia Boutin,  MD 08/18/2018

## 2018-08-18 NOTE — Progress Notes (Signed)
I have discussed the procedure for the virtual visit with the patient who has given consent to proceed with assessment and treatment.   Brynnan Rodenbaugh L Kandee Escalante, CMA     

## 2018-08-20 MED FILL — RIZATRIPTAN BENZOATE 10 MG: 10 | 30 days supply | Qty: 18 | Fill #1

## 2018-08-21 MED FILL — OMEPRAZOLE 40 MG CPDR: 40 | 30 days supply | Qty: 30 | Fill #3

## 2018-08-26 ENCOUNTER — Encounter: Payer: Self-pay | Admitting: Family Medicine

## 2018-08-26 ENCOUNTER — Ambulatory Visit
Admission: RE | Admit: 2018-08-26 | Discharge: 2018-08-26 | Disposition: A | Discharge status: Home / Self Care | Payer: No Typology Code available for payment source | Source: Ambulatory Visit | Attending: Obstetrics & Gynecology | Admitting: Obstetrics & Gynecology

## 2018-08-26 ENCOUNTER — Other Ambulatory Visit: Payer: Self-pay

## 2018-08-26 DIAGNOSIS — Z9189 Other specified personal risk factors, not elsewhere classified: Secondary | ICD-10-CM

## 2018-08-26 MED ORDER — GADOBUTROL 1 MMOL/ML IV SOLN
10.0000 mL | Freq: Once | INTRAVENOUS | Status: AC | PRN
  Administered 2018-08-26: 10 mL via INTRAVENOUS

## 2018-09-08 ENCOUNTER — Other Ambulatory Visit: Payer: Self-pay

## 2018-09-08 ENCOUNTER — Ambulatory Visit (INDEPENDENT_AMBULATORY_CARE_PROVIDER_SITE_OTHER): Payer: No Typology Code available for payment source | Admitting: Family Medicine

## 2018-09-08 ENCOUNTER — Encounter: Payer: Self-pay | Admitting: Family Medicine

## 2018-09-08 VITALS — BP 104/72 | HR 59 | Temp 97.9°F | Resp 16 | Ht 67.0 in | Wt 213.5 lb

## 2018-09-08 DIAGNOSIS — G43109 Migraine with aura, not intractable, without status migrainosus: Secondary | ICD-10-CM | POA: Diagnosis not present

## 2018-09-08 DIAGNOSIS — Z0001 Encounter for general adult medical examination with abnormal findings: Secondary | ICD-10-CM | POA: Diagnosis not present

## 2018-09-08 DIAGNOSIS — E559 Vitamin D deficiency, unspecified: Secondary | ICD-10-CM

## 2018-09-08 DIAGNOSIS — Z0000 Encounter for general adult medical examination without abnormal findings: Secondary | ICD-10-CM

## 2018-09-08 DIAGNOSIS — E669 Obesity, unspecified: Secondary | ICD-10-CM | POA: Diagnosis not present

## 2018-09-08 LAB — VITAMIN D 25 HYDROXY (VIT D DEFICIENCY, FRACTURES): VITD: 28.72 ng/mL — ABNORMAL LOW (ref 30.00–100.00)

## 2018-09-08 LAB — BASIC METABOLIC PANEL
BUN: 9 mg/dL (ref 6–23)
CO2: 28 mEq/L (ref 19–32)
Calcium: 9.1 mg/dL (ref 8.4–10.5)
Chloride: 104 mEq/L (ref 96–112)
Creatinine, Ser: 0.91 mg/dL (ref 0.40–1.20)
GFR: 86.37 mL/min (ref >60.00)
Glucose, Bld: 78 mg/dL (ref 70–99)
Potassium: 4.3 mEq/L (ref 3.5–5.1)
Sodium: 139 mEq/L (ref 135–145)

## 2018-09-08 LAB — CBC WITH DIFFERENTIAL/PLATELET
Basophils Absolute: 0.1 10*3/uL (ref 0.0–0.1)
Basophils Relative: 1.3 % (ref 0.0–3.0)
Eosinophils Absolute: 0 10*3/uL (ref 0.0–0.7)
Eosinophils Relative: 0.8 % (ref 0.0–5.0)
HCT: 35.3 % — ABNORMAL LOW (ref 36.0–46.0)
Hemoglobin: 11.4 g/dL — ABNORMAL LOW (ref 12.0–15.0)
Lymphocytes Relative: 50.9 % — ABNORMAL HIGH (ref 12.0–46.0)
Lymphs Abs: 2.9 10*3/uL (ref 0.7–4.0)
MCHC: 32.2 g/dL (ref 30.0–36.0)
MCV: 85.3 fl (ref 78.0–100.0)
Monocytes Absolute: 0.4 10*3/uL (ref 0.1–1.0)
Monocytes Relative: 6.3 % (ref 3.0–12.0)
Neutro Abs: 2.3 10*3/uL (ref 1.4–7.7)
Neutrophils Relative %: 40.7 % — ABNORMAL LOW (ref 43.0–77.0)
Platelets: 310 10*3/uL (ref 150.0–400.0)
RBC: 4.14 Mil/uL (ref 3.87–5.11)
RDW: 14.4 % (ref 11.5–15.5)
WBC: 5.8 10*3/uL (ref 4.0–10.5)

## 2018-09-08 LAB — LIPID PANEL
Cholesterol: 176 mg/dL (ref 0–200)
HDL: 50.1 mg/dL (ref >39.00)
LDL Cholesterol: 112 mg/dL — ABNORMAL HIGH (ref 0–99)
NonHDL: 126.3
Total CHOL/HDL Ratio: 4
Triglycerides: 72 mg/dL (ref 0.0–149.0)
VLDL: 14.4 mg/dL (ref 0.0–40.0)

## 2018-09-08 LAB — HEPATIC FUNCTION PANEL
ALT: 12 U/L (ref 0–35)
AST: 12 U/L (ref 0–37)
Albumin: 3.8 g/dL (ref 3.5–5.2)
Alkaline Phosphatase: 40 U/L (ref 39–117)
Bilirubin, Direct: 0.1 mg/dL (ref 0.0–0.3)
Total Bilirubin: 0.4 mg/dL (ref 0.2–1.2)
Total Protein: 6.4 g/dL (ref 6.0–8.3)

## 2018-09-08 LAB — TSH: TSH: 0.66 u[IU]/mL (ref 0.35–4.50)

## 2018-09-08 MED ORDER — TOPIRAMATE 50 MG PO TABS: ORAL_TABLET | ORAL | 3 refills | Status: DC

## 2018-09-08 MED FILL — TOPIRAMATE 50 MG TABLET: 50 | 30 days supply | Qty: 60 | Fill #0

## 2018-09-08 NOTE — Assessment & Plan Note (Signed)
Pt's PE WNL w/ exception of obesity and known thyromegaly.  UTD on GYN.  Check labs.  Anticipatory guidance provided.

## 2018-09-08 NOTE — Assessment & Plan Note (Signed)
Deteriorated.  Pt is having increased frequency of HAs despite propranolol.  Will add Topamax and monitor for improvement.  If no improvement, will need neuro referral.  Pt expressed understanding and is in agreement w/ plan.

## 2018-09-08 NOTE — Progress Notes (Signed)
   Subjective:    Patient ID: Beryle Quant, female    DOB: 10/13/1985, 33 y.o.   MRN: [id number]  HPI CPE- UTD on GYN ([facility]), down 9 lbs from last year's CPE   Review of Systems Patient reports no vision/ hearing changes, adenopathy,fever, weight change,  persistant/recurrent hoarseness , swallowing issues, chest pain, palpitations, edema, persistant/recurrent cough, hemoptysis, dyspnea (rest/exertional/paroxysmal nocturnal), gastrointestinal bleeding (melena, rectal bleeding), abdominal pain, significant heartburn, bowel changes, GU symptoms (dysuria, hematuria, incontinence), Gyn symptoms (abnormal  bleeding, pain),  syncope, focal weakness, memory loss, numbness & tingling, skin/hair/nail changes, abnormal bruising or bleeding, anxiety, or depression.   Migraines- deteriorated.  Pt is having 'a couple a week'.  Will take Maxalt, Tylenol.  Already on Propranolol.  Cannot determine a trigger.  Not occurring at the same time of day.  sxs started worsening in March.    Objective:   Physical Exam General Appearance:    Alert, cooperative, no distress, appears stated age, obese  Head:    Normocephalic, without obvious abnormality, atraumatic  Eyes:    PERRL, conjunctiva/corneas clear, EOM's intact, fundi    benign, both eyes  Ears:    Normal TM's and external ear canals, both ears  Nose:   Nares normal, septum midline, mucosa normal, no drainage    or sinus tenderness  Throat:   Lips, mucosa, and tongue normal; teeth and gums normal  Neck:   Supple, symmetrical, trachea midline, no adenopathy;    Thyroid: diffuse thyromegaly w/o nodularity  Back:     Symmetric, no curvature, ROM normal, no CVA tenderness  Lungs:     Clear to auscultation bilaterally, respirations unlabored  Chest Wall:    No tenderness or deformity   Heart:    Regular rate and rhythm, S1 and S2 normal, no murmur, rub   or gallop  Breast Exam:    Deferred to GYN  Abdomen:     Soft, non-tender, bowel sounds active  all four quadrants,    no masses, no organomegaly  Genitalia:    Deferred to GYN  Rectal:    Extremities:   Extremities normal, atraumatic, no cyanosis or edema  Pulses:   2+ and symmetric all extremities  Skin:   Skin color, texture, turgor normal, no rashes or lesions  Lymph nodes:   Cervical, supraclavicular, and axillary nodes normal  Neurologic:   CNII-XII intact, normal strength, sensation and reflexes    throughout          Assessment & Plan:

## 2018-09-08 NOTE — Assessment & Plan Note (Signed)
Ongoing issue for pt.  She is down 9 lbs since last year's CPE.  Applauded her efforts and encouraged her to continue working on healthy diet and regular exercise.  Check labs to risk stratify.

## 2018-09-08 NOTE — Assessment & Plan Note (Signed)
Pt w/ hx of this.  Check labs and replete prn. 

## 2018-09-08 NOTE — Patient Instructions (Signed)
Follow up in 1 month to recheck migraines We'll notify you of your lab results and make any changes if needed Continue to work on healthy diet and regular exercise- you can do it! Continue the Propranolol as directed START the Topamax as directed- titrate as directed Keep a headache log- when, how long, associated sxs, etc Call with any questions or concerns Stay Safe!

## 2018-09-18 MED FILL — PROPRANOLOL HCL ER 60 MG CP: 60 | 30 days supply | Qty: 30 | Fill #2

## 2018-09-18 MED FILL — ZOLPIDEM TARTRATE 5 MG TAB: 5 | 30 days supply | Qty: 30 | Fill #0

## 2018-09-18 MED FILL — RIZATRIPTAN BENZOATE 10 MG: 10 | 30 days supply | Qty: 18 | Fill #2

## 2018-09-18 MED FILL — OMEPRAZOLE 40 MG CPDR: 40 | 30 days supply | Qty: 30 | Fill #4

## 2018-09-29 ENCOUNTER — Telehealth: Payer: Self-pay | Admitting: Family Medicine

## 2018-09-29 ENCOUNTER — Ambulatory Visit: Payer: Self-pay

## 2018-09-29 NOTE — Telephone Encounter (Signed)
We need to decrease back to previous dose and see if sxs improve

## 2018-09-29 NOTE — Telephone Encounter (Signed)
Pt called stating that she has tingling in her hands and feet from her topamax.  She states it has been going on for about 2 weeks. She rates the symptoms as mild. Call was transferred to office for advice.  Reason for Disposition . Caller has NON-URGENT medication question about med that PCP prescribed and triager unable to answer question  Answer Assessment - Initial Assessment Questions 1. SYMPTOMS: "Do you have any symptoms?"     Tingling in hands and feet 2. SEVERITY: If symptoms are present, ask "Are they mild, moderate or severe?"    Mild to moderate.  Protocols used: MEDICATION QUESTION CALL-A-AH

## 2018-09-29 NOTE — Telephone Encounter (Signed)
Patient called in to check on status of message.  Advised note below.  Pt states understanding and she is going back to the 1/2 tablet in the morning and 1/2 tablet in the evening.  She is aware to call us to let us know status of symptoms on this dosage.

## 2018-09-29 NOTE — Telephone Encounter (Signed)
Patient stated that she is experiencing numbness and tingling in her hands and feets. She started TOPAMAX around June 8th and about a week after started noticing the numbness and tingling. Stated that it comes and goes randomly, but has noticed numbness and tingling worsened after dose increase. Please advise

## 2018-09-29 NOTE — Telephone Encounter (Signed)
Medication Refill - Medication: topiramate (TOPAMAX) 50 MG tablet   Pt is experiencing numbness/tingling in hands and feet. Noticed this for a couple weeks. It gradually became more noticeable, this week symptoms increased.  [phone] VM

## 2018-10-03 MED FILL — TOPIRAMATE 50 MG TABLET: 50 | 30 days supply | Qty: 60 | Fill #1

## 2018-10-07 ENCOUNTER — Ambulatory Visit: Payer: No Typology Code available for payment source | Admitting: Family Medicine

## 2018-10-10 ENCOUNTER — Ambulatory Visit (INDEPENDENT_AMBULATORY_CARE_PROVIDER_SITE_OTHER): Payer: No Typology Code available for payment source | Admitting: Family Medicine

## 2018-10-10 ENCOUNTER — Other Ambulatory Visit: Payer: Self-pay

## 2018-10-10 ENCOUNTER — Encounter: Payer: Self-pay | Admitting: Family Medicine

## 2018-10-10 DIAGNOSIS — G43109 Migraine with aura, not intractable, without status migrainosus: Secondary | ICD-10-CM

## 2018-10-10 NOTE — Progress Notes (Signed)
I have discussed the procedure for the virtual visit with the patient who has given consent to proceed with assessment and treatment.   Pt unable to obtain vitals  Pt is only taking 1/2 tablet of the topamax once a day. Said she felt very numb if she went higher than that.   Davis Gourd, CMA

## 2018-10-10 NOTE — Progress Notes (Signed)
Virtual Visit via Video   I connected with patient on 10/10/18 at 10:00 AM EDT by a video enabled telemedicine application and verified that I am speaking with the correct person using two identifiers.  Location patient: Home Location provider: [facility], Office Persons participating in the virtual visit: Patient, Provider, CMA (Jess B)  I discussed the limitations of evaluation and management by telemedicine and the availability of in person appointments. The patient expressed understanding and agreed to proceed.  Subjective:   HPI:   Migraines- pt reports that Topamax was 'starting to help' but as she titrated dose she was developing 'numbness and tingling of hands/feet'.  Has now been taking 1/2 tab once daily w/o difficulty but develops numbness/tingling when she increases to BID.  Pt has menstrual migraines but will develop migraines at other times w/o obvious trigger.  Still having to use rescue medicine.  Pt has had 4-5 migraines in last month.  Pt is interested in referral to HA specialist to discuss other options.  ROS:   See pertinent positives and negatives per HPI.  Patient Active Problem List   Diagnosis Date Noted  . Food intolerance 05/26/2018  . Adverse food reaction 05/26/2018  . Generalized abdominal pain 05/26/2018  . Irritable bowel syndrome 02/21/2018  . Family history of breast cancer in mother 09/03/2016  . Vitamin D deficiency   . Physical exam 05/03/2014  . Thyromegaly 05/03/2014  . Insomnia 07/16/2013  . Migraine with aura 04/30/2013  . Obesity (BMI 30-39.9) 04/30/2013    Social History   Tobacco Use  . Smoking status: Never Smoker  . Smokeless tobacco: Never Used  Substance Use Topics  . Alcohol use: Yes    Comment: rarely    Current Outpatient Medications:  Marland Kitchen.  Multiple Vitamin (MULTIVITAMIN) capsule, Take 1 capsule by mouth daily., Disp: , Rfl:  .  omeprazole (PRILOSEC) 40 MG capsule, Take 40 mg by mouth daily., Disp: , Rfl:  .   propranolol ER (INDERAL LA) 60 MG 24 hr capsule, Take 1 capsule (60 mg total) by mouth daily., Disp: 30 capsule, Rfl: 3 .  rizatriptan (MAXALT) 10 MG tablet, Take 2 tablets (20 mg total) by mouth as needed for migraine. May repeat in 2 hours if needed, Disp: 20 tablet, Rfl: 6 .  topiramate (TOPAMAX) 50 MG tablet, 1/2 tab(25mg ) QHS x1 week, then 1/2 tab BID x1 week, then 1/2 tab QAM and 1 tab (50mg ) QHS x1 week, then 1 tab BID, Disp: 60 tablet, Rfl: 3 .  zolpidem (AMBIEN) 5 MG tablet, Take 1 tablet (5 mg total) by mouth at bedtime as needed for sleep., Disp: 30 tablet, Rfl: 3  Allergies  Allergen Reactions  . Monistat 1 [Tioconazole] Swelling  . Other Other (See Comments)    Artificial sweetners - migraines MSG - migraines    Objective:   There were no vitals taken for this visit. AAOx3, NAD NCAT, EOMI No obvious CN deficits Coloring WNL Pt is able to speak clearly, coherently without shortness of breath or increased work of breathing.  Thought process is linear.  Mood is appropriate.   Assessment and Plan:   Migraine- pt reports Topamax was helping w/ HAs but caused her to have numbness/tingling in hands and feet.  When she decreased the dose to a tolerable level, it was no longer effective in preventing migraines.  Since she is only on 25mg  daily, will have her stop entirely.  Continue propranolol.  Refer to Neuro for additional tx options.  Pt  expressed understanding and is in agreement w/ plan.    Annye Asa, MD 10/10/2018

## 2018-10-13 MED FILL — TOPIRAMATE 50 MG TABLET: 50 | 30 days supply | Qty: 60 | Fill #1

## 2018-10-15 MED FILL — ZOLPIDEM TARTRATE 5 MG TAB: 5 | 30 days supply | Qty: 30 | Fill #1

## 2018-10-15 MED FILL — PROPRANOLOL HCL ER 60 MG CP: 60 | 30 days supply | Qty: 30 | Fill #3

## 2018-10-18 MED FILL — OMEPRAZOLE 40 MG CPDR: 40 | 30 days supply | Qty: 30 | Fill #5

## 2018-11-03 NOTE — Progress Notes (Signed)
Virtual Visit via Video Note The purpose of this virtual visit is to provide medical care while limiting exposure to the novel coronavirus.    Consent was obtained for video visit:  Yes Answered questions that patient had about telehealth interaction:  Yes I discussed the limitations, risks, security and privacy concerns of performing an evaluation and management service by telemedicine. I also discussed with the patient that there may be a patient responsible charge related to this service. The patient expressed understanding and agreed to proceed.  Pt location: Home Physician Location: Home Name of referring provider:  Sheliah Hatchabori, Katherine E, MD I connected with Shannon Anderson at patients initiation/request on 11/05/2018 at  9:10 AM EDT by video enabled telemedicine application and verified that I am speaking with the correct person using two identifiers. Pt MRN:  [id number] Pt DOB:  1985-07-03 Video Participants:  Shannon Anderson   History of Present Illness:  Shannon Anderson is a 33 year old woman who presents for migraines.  History supplemented by referring provider note.  Onset:  Early teenage years.  Initially a few times a year.  Well controlled until around 2015.   Location:  Usually right-sided, occasionally left sided Quality:  Non-throbbing/sharp Intensity: 8-10/10.  She denies new headache, thunderclap headache or severe headache that wakes her from sleep. Aura:  Peripheral wavy vision. Premonitory Phase:  none Postdrome:  Dull head pain/soreness for several hours, drowsiness Associated symptoms:  Photophobia, phonophobia, osmophobia.  Rarely nausea.  She denies associated vomiting, autonomic symptoms, unilateral numbness or weakness. Duration:  2 to 4 hours.  She goes to sleep for a couple of hours. Frequency:  2 to 3 days a week. Frequency of abortive medication: 2 to 3 days a week Triggers:  MSG, hormonal Relieving factors:  sleep Activity:  aggravates  Rescue  protocol:  Maxalt with Excedrin Current NSAIDS:  none Current analgesics:  Excedrin Current triptans:  Maxalt 10mg  Current ergotamine:  none Current anti-emetic:  none Current muscle relaxants:  none Current anti-anxiolytic:  none Current sleep aide:  Ambien Current Antihypertensive medications:  Propranolol ER 60mg  (not Current Antidepressant medications:  none Current Anticonvulsant medications: none Current anti-CGRP:  none Current Vitamins/Herbal/Supplements:  MVI Current Antihistamines/Decongestants: none Other therapy:  none Hormone/birth control:  none  Past NSAIDS:  ibuprofen Past analgesics:  Excedrin Migraine Past abortive triptans:  Relpax, sumatriptan 50mg  Past abortive ergotamine:  none Past muscle relaxants:  none Past anti-emetic:  Zofran 4mg , Compazine 10mg  Past antihypertensive medications:  none Past antidepressant medications:  Effexor XR 75mg , Wellbutrin, Celexa, Prozac, trazodone Past anticonvulsant medications:  topiramate (50mg  caused paresthesias) Past anti-CGRP:  none Past vitamins/Herbal/Supplements:  none Past antihistamines/decongestants:  none Other past therapies:  none  Caffeine:  No coffee.  Occasional sweet tea. Diet:  Does not drink enough water.  No soda. Exercise:  Not routine Depression:  no; Anxiety:  some Other pain:  no Sleep hygiene:  Third shift so sleep off Family history of headache:  No  BMP and hepatic panel from 09/08/18 unremarkable  Past Medical History: Past Medical History:  Diagnosis Date  . Depression   . Migraine   . Vitamin D deficiency     Medications: Outpatient Encounter Medications as of 11/05/2018  Medication Sig Note  . Multiple Vitamin (MULTIVITAMIN) capsule Take 1 capsule by mouth daily.   Marland Kitchen. omeprazole (PRILOSEC) 40 MG capsule Take 40 mg by mouth daily.   . propranolol ER (INDERAL LA) 60 MG 24 hr capsule Take 1 capsule (60 mg  total) by mouth daily.   . rizatriptan (MAXALT) 10 MG tablet Take 2 tablets  (20 mg total) by mouth as needed for migraine. May repeat in 2 hours if needed   . topiramate (TOPAMAX) 50 MG tablet 1/2 tab(25mg ) QHS x1 week, then 1/2 tab BID x1 week, then 1/2 tab QAM and 1 tab (50mg ) QHS x1 week, then 1 tab BID 10/10/2018: Pt is currently taking 1/2 tablet daily  . zolpidem (AMBIEN) 5 MG tablet Take 1 tablet (5 mg total) by mouth at bedtime as needed for sleep.    No facility-administered encounter medications on file as of 11/05/2018.     Allergies: Allergies  Allergen Reactions  . Monistat 1 [Tioconazole] Swelling  . Other Other (See Comments)    Artificial sweetners - migraines MSG - migraines    Family History: Family History  Problem Relation Age of Onset  . Hypertension Mother   . Cancer Mother [id number]       breast  . Breast cancer Mother   . Depression Mother   . Hypertension Father   . Diabetes Sister   . Cancer Maternal Aunt   . Breast cancer Maternal Aunt   . Stroke Maternal Grandmother   . Diabetes Paternal Grandmother   . Breast cancer Cousin   . Breast cancer Sister     Social History: Social History   Socioeconomic History  . Marital status: Single    Spouse name: Not on file  . Number of children: 1  . Years of education: Not on file  . Highest education level: Not on file  Occupational History  . Not on file  Social Needs  . Financial resource strain: Not on file  . Food insecurity    Worry: Not on file    Inability: Not on file  . Transportation needs    Medical: Not on file    Non-medical: Not on file  Tobacco Use  . Smoking status: Never Smoker  . Smokeless tobacco: Never Used  Substance and Sexual Activity  . Alcohol use: Yes    Comment: rarely  . Drug use: Never  . Sexual activity: Yes  Lifestyle  . Physical activity    Days per week: Not on file    Minutes per session: Not on file  . Stress: Not on file  Relationships  . Social Musicianconnections    Talks on phone: Not on file    Gets together: Not on file    Attends  religious service: Not on file    Active member of club or organization: Not on file    Attends meetings of clubs or organizations: Not on file    Relationship status: Not on file  . Intimate partner violence    Fear of current or ex partner: Not on file    Emotionally abused: Not on file    Physically abused: Not on file    Forced sexual activity: Not on file  Other Topics Concern  . Not on file  Social History Narrative  . Not on file    Observations/Objective:   Temperature (!) 97.1 F (36.2 C), height 5\' 7"  (1.702 m), weight 209 lb (94.8 kg). No acute distress.  Alert and oriented.  Speech fluent and not dysarthric.  Language intact.  Eyes orthophoric on primary gaze.  Face symmetric.  Assessment and Plan:   Migraine with aura, without status migrainosus, not intractable  1.  For preventative management, Trokendi XR 50mg  at bedtime.  Topiramate was effective and extended-release  less likely causes paresthesias.  She will stop propranolol 2.  For abortive therapy, will try Ubrelvy.  Stop rizatripan. 3.  Limit use of pain relievers to no more than 2 days out of week to prevent risk of rebound or medication-overuse headache. 4.  Keep headache diary 5.  Exercise, hydration, caffeine cessation, sleep hygiene, monitor for and avoid triggers 6.  Consider:  magnesium citrate 400mg  daily, riboflavin 400mg  daily, and coenzyme Q10 100mg  three times daily 7. Always keep in mind that currently taking a hormone or birth control may be a possible trigger or aggravating factor for migraine. 8. Follow up 4 months   Follow Up Instructions:    -I discussed the assessment and treatment plan with the patient. The patient was provided an opportunity to ask questions and all were answered. The patient agreed with the plan and demonstrated an understanding of the instructions.   The patient was advised to call back or seek an in-person evaluation if the symptoms worsen or if the condition fails to  improve as anticipated.  Dudley Major, DO

## 2018-11-05 ENCOUNTER — Encounter: Payer: Self-pay | Admitting: Neurology

## 2018-11-05 ENCOUNTER — Other Ambulatory Visit: Payer: Self-pay

## 2018-11-05 ENCOUNTER — Telehealth: Payer: Self-pay

## 2018-11-05 ENCOUNTER — Telehealth (INDEPENDENT_AMBULATORY_CARE_PROVIDER_SITE_OTHER): Payer: No Typology Code available for payment source | Admitting: Neurology

## 2018-11-05 VITALS — Temp 97.1°F | Ht 67.0 in | Wt 209.0 lb

## 2018-11-05 DIAGNOSIS — G43109 Migraine with aura, not intractable, without status migrainosus: Secondary | ICD-10-CM | POA: Diagnosis not present

## 2018-11-05 MED ORDER — TROKENDI XR 50 MG PO CP24: 1.0000 | ORAL_CAPSULE | Freq: Every day | ORAL | 3 refills | Status: DC

## 2018-11-05 MED ORDER — UBRELVY 100 MG PO TABS: 1.0000 | ORAL_TABLET | ORAL | 3 refills | Status: DC | PRN

## 2018-11-05 NOTE — Telephone Encounter (Signed)
Called Pt to advise we have copay cards for Ubrelvy and Trokendi. Pt gave permission to take pictures of cards and text to her cell phone. I am mailing the originals to her along with the Advanced directive information.  Roselyn Meier [id number] PCN: [id number] GRP: [id number] ID [id number]  Trokendi TWKMQ:[id number] RxPCN: [id number] ISSUER: ([id number] RxGRP: [id number] ID: [id number]

## 2018-11-05 NOTE — Patient Instructions (Addendum)
1.  Start Trokendi XR (extended release topiramate) 50mg  at bedtime.  If headaches not improved in 4 weeks, contact me and we can increase dose.  Stop propranolol 2.  Stop rizatriptan.  When you get a migraine, take Ubrelvy 100mg .  May repeat dose once after 2 hours if needed (maximum 2 tablets in 24 hours) 3.  Limit use of pain relievers to no more than 2 days out of week to prevent risk of rebound or medication-overuse headache. 4.  Keep headache diary 5.  Exercise, hydration, caffeine cessation, sleep hygiene, monitor for and avoid triggers 6.  Consider:  magnesium citrate 400mg  daily, riboflavin 400mg  daily, and coenzyme Q10 100mg  three times daily 7. Always keep in mind that currently taking a hormone or birth control may be a possible trigger or aggravating factor for migraine. 8. Follow up in 4 months

## 2018-11-11 ENCOUNTER — Telehealth: Payer: Self-pay | Admitting: Neurology

## 2018-11-11 NOTE — Telephone Encounter (Signed)
Called and talked to Pt about the copay cards I sent to her in a text and mailed the original to her. She said she rcvd the cards, she took them with her to the pharmacy and was told the cards were not valid without a PA.  I called [facility], spoke with Joghn, walked him through how to enter the copay card. Pt can now pick up Ubrelvy for $20.  Called pt and advised her.

## 2018-11-11 NOTE — Telephone Encounter (Signed)
Patient is needing Roselyn Meier PA done for insurance so she can get medication. Thanks!

## 2018-11-17 MED FILL — OMEPRAZOLE 40 MG CPDR: 40 | 30 days supply | Qty: 30 | Fill #6

## 2018-12-01 ENCOUNTER — Encounter: Payer: Self-pay | Admitting: Family Medicine

## 2018-12-01 MED ORDER — ZOLPIDEM TARTRATE 10 MG PO TABS: 10.0000 mg | ORAL_TABLET | Freq: Every evening | ORAL | 1 refills | Status: DC | PRN

## 2018-12-02 ENCOUNTER — Encounter: Payer: Self-pay | Admitting: *Deleted

## 2018-12-02 ENCOUNTER — Other Ambulatory Visit: Payer: Self-pay | Admitting: Neurology

## 2018-12-02 MED ORDER — TROKENDI XR 100 MG PO CP24: 100.0000 mg | ORAL_CAPSULE | Freq: Every day | ORAL | 3 refills | Status: DC

## 2018-12-02 NOTE — Progress Notes (Signed)
Shannon Anderson (Shannon Anderson) Rx #: [id number] Shannon Anderson 100MG  tablets   Form Med[employer] ePA Form Created 27 days ago Sent to Plan Determination Favorable 11 hours ago

## 2018-12-15 MED FILL — FLUCONAZOLE 150 MG TABS: 150 | 1 days supply | Qty: 1 | Fill #0

## 2018-12-25 ENCOUNTER — Institutional Professional Consult (permissible substitution): Payer: No Typology Code available for payment source | Admitting: Internal Medicine

## 2018-12-27 ENCOUNTER — Ambulatory Visit (INDEPENDENT_AMBULATORY_CARE_PROVIDER_SITE_OTHER)
Admission: RE | Admit: 2018-12-27 | Discharge: 2018-12-27 | Disposition: A | Discharge status: Home / Self Care | Payer: No Typology Code available for payment source | Source: Ambulatory Visit

## 2018-12-27 DIAGNOSIS — R0981 Nasal congestion: Secondary | ICD-10-CM | POA: Diagnosis not present

## 2018-12-27 NOTE — ED Provider Notes (Signed)
Virtual Visit via Video Note:  Shannon Anderson  initiated request for Telemedicine visit with [facility] team. I connected with Shannon Anderson  on 12/27/2018 at 8:11 AM  for a synchronized telemedicine visit using a video enabled HIPPA compliant telemedicine application. I verified that I am speaking with Shannon Anderson  using two identifiers. Shannon Hall-Potvin, PA-C  was physically located in a [facility] site and Shannon Anderson was located at a different location.   The limitations of evaluation and management by telemedicine as well as the availability of in-person appointments were discussed. Patient was informed that she  may incur a bill ( including co-pay) for this virtual visit encounter. Shannon Anderson  expressed understanding and gave verbal consent to proceed with virtual visit.     History of Present Illness:Shannon Anderson  is a 33 y.o. female with h/o allergies presenting with 3 day course of nasal congestion, sinus pressure sore throat.  She has tried cold & flu, vicks humidifier, nasal washes w/ minimal relief of symptoms.  Works as Therapist, sports; has had contact w/ Covid patients, though denies cover exposure outside of work/without PPE.  Review of Systems  Constitutional: Positive for malaise/fatigue. Negative for fever.  HENT: Positive for congestion, sinus pain and sore throat. Negative for ear pain.   Eyes: Negative for pain and redness.  Respiratory: Negative for cough, hemoptysis, sputum production, shortness of breath and wheezing.   Cardiovascular: Negative for chest pain and leg swelling.  Gastrointestinal: Negative for blood in stool, diarrhea, nausea and vomiting.  Musculoskeletal: Negative for joint pain and myalgias.  Skin: Negative for itching and rash.  Neurological: Positive for headaches. Negative for weakness.    Past Medical History:  Diagnosis Date  . Depression   . Migraine   . Vitamin D deficiency     Allergies   Allergen Reactions  . Monistat 1 [Tioconazole] Swelling  . Other Other (See Comments)    Artificial sweetners - migraines MSG - migraines        Observations/Objective: 33 year old female Sitting in no acute distress.  Patient is able to speak in full sentences without coughing, sneezing, wheezing.  Assessment and Plan: 1.  Nasal congestion For 3 days, low concern for ABS at this time.  Due to profession/exposure risk, COVID test pending.  Continue to treat symptomatically, add intranasal steroid.  Return precautions discussed, patient verbalized understanding and is agreeable to plan.  Follow Up Instructions: Patient to seek in person evaluation for persistent/worsening symptoms.   I discussed the assessment and treatment plan with the patient. The patient was provided an opportunity to ask questions and all were answered. The patient agreed with the plan and demonstrated an understanding of the instructions.   The patient was advised to call back or seek an in-person evaluation if the symptoms worsen or if the condition fails to improve as anticipated.  I provided 15 minutes of non-face-to-face time during this encounter.    [place], PA-C  12/27/2018 8:11 AM        Anderson, [name], PA-C 12/27/18 0831

## 2018-12-27 NOTE — Discharge Instructions (Addendum)
Continue over-the-counter medication regimen: Would recommend adding intranasal steroid such as Flonase, Nasacort. Use intranasal steroid as follows: 2 sprays in each nostril once daily for the next week, then may decrease to 1 spray in each nostril once daily. We will need to seek in person evaluation for persistent or worsening symptoms such as fever, ear pain, difficulty swallowing, nausea, vomiting, cough, shortness of breath.  The testing sites are open at this time from 0800-1600 Monday through Friday         [address] [place].

## 2018-12-28 ENCOUNTER — Telehealth: Payer: No Typology Code available for payment source | Admitting: Family

## 2018-12-28 DIAGNOSIS — Z20822 Contact with and (suspected) exposure to covid-19: Secondary | ICD-10-CM

## 2018-12-28 MED ORDER — ALBUTEROL SULFATE HFA 108 (90 BASE) MCG/ACT IN AERS: 2.0000 | INHALATION_SPRAY | Freq: Four times a day (QID) | RESPIRATORY_TRACT | 0 refills | Status: DC | PRN

## 2018-12-28 MED ORDER — BENZONATATE 100 MG PO CAPS: 100.0000 mg | ORAL_CAPSULE | Freq: Three times a day (TID) | ORAL | 0 refills | Status: DC | PRN

## 2018-12-28 NOTE — Addendum Note (Signed)
Addended by: Evelina Dun A on: 12/28/2018 11:02 AM   Modules accepted: Orders

## 2018-12-28 NOTE — Progress Notes (Signed)
E-Visit for Corona Virus Screening   Your current symptoms could be consistent with the coronavirus.  Many health care providers can now test patients at their office but not all are.  Sh[facility] Anderson has multiple testing sites. For information on our COVID testing locations and hours go to [email or web address]  Please quarantine yourself while awaiting your test results.  We are enrolling you in our MyChart Home Montioring for COVID19 . Daily you will receive a questionnaire within the MyChart website. Our COVID 19 response team willl be monitoriing your responses daily.  *If your symptoms of increased heart rate and shortness of breath worsen you need to go to ED.   You can go to one of the  testing sites listed below, while they are opened (see hours). You do not need an order and will stay in your car during the test. You do need to self isolate until your results return and if positive 14 days from when your symptoms started and until you are 3 days symptom free.   Testing Locations (Monday - Friday, 8 a.m. - 3:30 p.m.) . [place]: [facility] at [facility], [address], [place], [place]  . [place]: [address], [address], [place], [place] (entrance off [employer])  . [facility]: (Closed each Monday): Testing site relocated to the short stay covered drive at [facility]. (Use the [facility] entrance to [facility] next to [facility].)   COVID-19 is a respiratory illness with symptoms that are similar to the flu. Symptoms are typically mild to moderate, but there have been cases of severe illness and death due to the virus. The following symptoms may appear 2-14 days after exposure: . Fever . Cough . Shortness of breath or difficulty breathing . Chills . Repeated shaking with chills . Muscle pain . Headache . Sore throat . New loss of taste or smell . Fatigue . Congestion  or runny nose . Nausea or vomiting . Diarrhea  It is vitally important that if you feel that you have an infection such as this virus or any other virus that you stay home and away from places where you may spread it to others.  You should self-quarantine for 14 days if you have symptoms that could potentially be coronavirus or have been in close contact a with a person diagnosed with COVID-19 within the last 2 weeks. You should avoid contact with people age 41 and older.   You should wear a mask or cloth face covering over your nose and mouth if you must be around other people or animals, including pets (even at home). Try to stay at least 6 feet away from other people. This will protect the people around you.  You can use medication such as A prescription cough medication called Tessalon Perles 100 mg. You may take 1-2 capsules every 8 hours as needed for cough and A prescription inhaler called Albuterol MDI 90 mcg /actuation 2 puffs every 4 hours as needed for shortness of breath, wheezing, cough  You may also take acetaminophen (Tylenol) as needed for fever.  Approximately 5 minutes was spent documenting and reviewing patient's chart.    Reduce your risk of any infection by using the same precautions used for avoiding the common cold or flu:  Marland Kitchen Wash your hands often with soap and warm water for at least 20 seconds.  If soap and water are not readily available, use an alcohol-based hand sanitizer with  at least 60% alcohol.  . If coughing or sneezing, cover your mouth and nose by coughing or sneezing into the elbow areas of your shirt or coat, into a tissue or into your sleeve (not your hands). . Avoid shaking hands with others and consider head nods or verbal greetings only. . Avoid touching your eyes, nose, or mouth with unwashed hands.  . Avoid close contact with people who are sick. . Avoid places or events with large numbers of people in one location, like concerts or sporting  events. . Carefully consider travel plans you have or are making. . If you are planning any travel outside or inside the [place], visit the CDC's Travelers' Health webpage for the latest health notices. . If you have some symptoms but not all symptoms, continue to monitor at home and seek medical attention if your symptoms worsen. . If you are having a medical emergency, call 911.  HOME CARE . Only take medications as instructed by your medical team. . Drink plenty of fluids and get plenty of rest. . A steam or ultrasonic humidifier can help if you have congestion.   GET HELP RIGHT AWAY IF YOU HAVE EMERGENCY WARNING SIGNS** FOR COVID-19. If you or someone is showing any of these signs seek emergency medical care immediately. Call 911 or proceed to your closest emergency facility if: . You develop worsening high fever. . Trouble breathing . Bluish lips or face . Persistent pain or pressure in the chest . New confusion . Inability to wake or stay awake . You cough up blood. . Your symptoms become more severe  **This list is not all possible symptoms. Contact your medical provider for any symptoms that are sever or concerning to you.   MAKE SURE YOU   Understand these instructions.  Will watch your condition.  Will get help right away if you are not doing well or get worse.  Your e-visit answers were reviewed by a board certified advanced clinical practitioner to complete your personal care plan.  Depending on the condition, your plan could have included both over the counter or prescription medications.  If there is a problem please reply once you have received a response from your provider.  Your safety is important to [place].  If you have drug allergies check your prescription carefully.    You can use MyChart to ask questions about today's visit, request a non-urgent call back, or ask for a work or school excuse for 24 hours related to this e-Visit. If it has been greater than 24 hours  you will need to follow up with your provider, or enter a new e-Visit to address those concerns. You will get an e-mail in the next two days asking about your experience.  I hope that your e-visit has been valuable and will speed your recovery. Thank you for using e-visits.

## 2018-12-29 ENCOUNTER — Encounter (INDEPENDENT_AMBULATORY_CARE_PROVIDER_SITE_OTHER): Payer: Self-pay

## 2018-12-29 ENCOUNTER — Other Ambulatory Visit: Payer: Self-pay

## 2018-12-29 DIAGNOSIS — Z20822 Contact with and (suspected) exposure to covid-19: Secondary | ICD-10-CM

## 2018-12-30 ENCOUNTER — Encounter (INDEPENDENT_AMBULATORY_CARE_PROVIDER_SITE_OTHER): Payer: Self-pay

## 2018-12-30 LAB — NOVEL CORONAVIRUS, NAA: SARS-CoV-2, NAA: NOT DETECTED

## 2018-12-31 ENCOUNTER — Encounter: Payer: Self-pay | Admitting: *Deleted

## 2018-12-31 NOTE — Progress Notes (Signed)
Edilia Bo Key: [id number] - PA Case ID: [id number] - Rx #: [id number] Need help? Call us at [phone] Status Sent to [facility] Trokendi XR 100MG  er capsules Form MedImpact ePA Form Original Claim Info 14

## 2019-01-04 ENCOUNTER — Encounter (INDEPENDENT_AMBULATORY_CARE_PROVIDER_SITE_OTHER): Payer: Self-pay

## 2019-01-05 MED FILL — TROKENDI XR 100 MG CAPSULE: 100 | 30 days supply | Qty: 30 | Fill #0

## 2019-01-05 NOTE — Progress Notes (Signed)
Prior Authorization is not required for this medication dosage form and strength at the quantity and days supply requested. This medication does not require a prior authorization because it is a covered benefit. Please note member must fill maintenance medications at [facility] outpatient pharmacies, please call [phone].

## 2019-01-21 ENCOUNTER — Telehealth: Payer: Self-pay | Admitting: *Deleted

## 2019-01-21 NOTE — Telephone Encounter (Signed)
Please advise 

## 2019-01-21 NOTE — Telephone Encounter (Signed)
Patient called in stating that she is trying to get private life insurance.  She was told that she needed to call her PCP and get a letter stating: -That she is only taking two medications for migraines. One she takes daily and one she takes PRN. -That her insomnia is mild and she is on one PRN medication.  -That her depression was a mild case and that there was no psychiatry involved.   Letter needs to be made out to [employer], on [address].   Patient is okay with letter being emailed to insurance agent  [email or web address]  With the subject RE: Shannon Anderson

## 2019-01-21 NOTE — Telephone Encounter (Signed)
Patient called back and asked that a copy also be sent to her at [email or web address]

## 2019-01-26 NOTE — Telephone Encounter (Signed)
LM making pt aware of this  °

## 2019-01-26 NOTE — Telephone Encounter (Signed)
Pt calling in to check on this. Please advise

## 2019-01-26 NOTE — Telephone Encounter (Signed)
Letter written, sent via MyChart to patient and printed for signature here in office.

## 2019-01-29 MED FILL — TROKENDI XR 100 MG CAPSULE: 100 | 30 days supply | Qty: 30 | Fill #1

## 2019-02-17 ENCOUNTER — Telehealth: Payer: Self-pay | Admitting: Family Medicine

## 2019-02-17 NOTE — Telephone Encounter (Signed)
Last OV 10/10/18 ambien last filled 12/01/18 #30 with 1

## 2019-02-17 NOTE — Telephone Encounter (Signed)
Pt called in asking for a refill on the Ambien 10mg , pt uses [facility] on [address] and pt can be reached at the home #

## 2019-02-19 MED ORDER — ZOLPIDEM TARTRATE 10 MG PO TABS: 10.0000 mg | ORAL_TABLET | Freq: Every evening | ORAL | 1 refills | Status: DC | PRN

## 2019-02-19 NOTE — Telephone Encounter (Signed)
Medication refilled as requested.

## 2019-03-01 NOTE — Progress Notes (Signed)
Virtual Visit via Video Note The purpose of this virtual visit is to provide medical care while limiting exposure to the novel coronavirus.    Consent was obtained for video visit:  Yes Answered questions that patient had about telehealth interaction:  Yes I discussed the limitations, risks, security and privacy concerns of performing an evaluation and management service by telemedicine. I also discussed with the patient that there may be a patient responsible charge related to this service. The patient expressed understanding and agreed to proceed.  Pt location: Home Physician Location: office Name of referring provider:  Midge Minium, MD I connected with Shannon Anderson at patients initiation/request on 03/02/2019 at 10:30 AM EST by video enabled telemedicine application and verified that I am speaking with the correct person using two identifiers. Pt MRN:  [id number] Pt DOB:  1985/09/26 Video Participants:  Shannon Anderson   History of Present Illness:  Shannon Anderson is a 33 year old woman who follows up for migraines.  UPDATE: Last visit, started Trokendi and discontinued propranolol.  Trokendi XR increased to 100mg .  Intensity:  Moderate to severe Duration:  With Ubrelvy, decreased intensity within 2 hours, resolves completely within 3 to 4 hours.  Needs to repeat dose of Ubrelvy Frequency:  2 to 3 a month Frequency of abortive medication: 2 to 3 a month Rescue protocol:  Ubrelvy Current NSAIDS:  none Current analgesics:  Excedrin Current triptans:  none Current ergotamine:  none Current anti-emetic:  none Current muscle relaxants:  none Current anti-anxiolytic:  none Current sleep aide:  Ambien Current Antihypertensive medications:  none Current Antidepressant medications:  none Current Anticonvulsant medications: Trokendi XR 100mg  at bedtime Current anti-CGRP:  Ubrelvy Current Vitamins/Herbal/Supplements:  MVI Current Antihistamines/Decongestants: none  Other therapy:  none Hormone/birth control:  none  Caffeine:  No coffee.  Cut back on sweet tea Diet:  Does not drink enough water.  No soda. Exercise:  Not routine Depression:  no; Anxiety:  some Other pain:  no Sleep hygiene:  Third shift so sleep off  Her insurance will change in January and therefore won't be able to afford Trokendi XR or [place].  Her insurance does not cover prescriptions.  She will need to use a prescription discount card.   HISTORY: Onset:  Early teenage years.  Initially a few times a year.  Well controlled until around 2015.   Location:  Usually right-sided, occasionally left sided Quality:  Non-throbbing/sharp Initial intensity: 8-10/10.  She denies new headache, thunderclap headache or severe headache that wakes her from sleep. Aura:  Peripheral wavy vision. Premonitory Phase:  none Postdrome:  Dull head pain/soreness for several hours, drowsiness Associated symptoms:  Photophobia, phonophobia, osmophobia.  Rarely nausea.  She denies associated vomiting, autonomic symptoms, unilateral numbness or weakness. Initial duration:  2 to 4 hours.  She goes to sleep for a couple of hours. Initial frequency:  2 to 3 days a week. Initial frequency of abortive medication: 2 to 3 days a week Triggers:  MSG, hormonal; dehydration, sleep deprivation Relieving factors:  Sleep Activity:  aggravates   Past NSAIDS:  ibuprofen Past analgesics:  Excedrin Migraine Past abortive triptans:  Relpax, sumatriptan 50mg , Maxalt 10mg  Past abortive ergotamine:  none Past muscle relaxants:  none Past anti-emetic:  Zofran 4mg , Compazine 10mg  Past antihypertensive medications:  propranolol ER 60mg  Past antidepressant medications:  Effexor XR 75mg , Wellbutrin, Celexa, Prozac, trazodone Past anticonvulsant medications:  topiramate (50mg  caused paresthesias) Past anti-CGRP:  none Past vitamins/Herbal/Supplements:  none Past antihistamines/decongestants:  none Other past  therapies:  none   Family history of headache:  No  Past Medical History: Past Medical History:  Diagnosis Date  . Depression   . Migraine   . Vitamin D deficiency     Medications: Outpatient Encounter Medications as of 03/02/2019  Medication Sig  . albuterol (VENTOLIN HFA) 108 (90 Base) MCG/ACT inhaler Inhale 2 puffs into the lungs every 6 (six) hours as needed for wheezing or shortness of breath.  Marland Kitchen. albuterol (VENTOLIN HFA) 108 (90 Base) MCG/ACT inhaler Inhale 2 puffs into the lungs every 6 (six) hours as needed for wheezing or shortness of breath.  . benzonatate (TESSALON PERLES) 100 MG capsule Take 1 capsule (100 mg total) by mouth 3 (three) times daily as needed.  . Multiple Vitamin (MULTIVITAMIN) capsule Take 1 capsule by mouth daily.  Marland Kitchen. omeprazole (PRILOSEC) 40 MG capsule Take 40 mg by mouth daily.  . Topiramate ER (TROKENDI XR) 100 MG CP24 Take 100 mg by mouth at bedtime.  Marland Kitchen. Ubrogepant (UBRELVY) 100 MG TABS Take 1 tablet by mouth as needed (May repeat dose in 2 hours if needed.  Maximum 2 tablets in 24 hours).  . zolpidem (AMBIEN) 10 MG tablet Take 1 tablet (10 mg total) by mouth at bedtime as needed for sleep.   No facility-administered encounter medications on file as of 03/02/2019.     Allergies: Allergies  Allergen Reactions  . Monistat 1 [Tioconazole] Swelling  . Other Other (See Comments)    Artificial sweetners - migraines MSG - migraines    Family History: Family History  Problem Relation Age of Onset  . Hypertension Mother   . Cancer Mother [id number]       breast  . Breast cancer Mother   . Depression Mother   . Hypertension Father   . Diabetes Sister   . Cancer Maternal Aunt   . Breast cancer Maternal Aunt   . Stroke Maternal Grandmother   . Diabetes Paternal Grandmother   . Breast cancer Cousin   . Breast cancer Sister     Social History: Social History   Socioeconomic History  . Marital status: Single    Spouse name: Not on file  . Number of  children: 1  . Years of education: 316  . Highest education level: Bachelor's degree (e.g., BA, AB, BS)  Occupational History  . Occupation: Charity fundraiserN at [employer]    Employer: [employer]  Social Needs  . Financial resource strain: Not on file  . Food insecurity    Worry: Not on file    Inability: Not on file  . Transportation needs    Medical: Not on file    Non-medical: Not on file  Tobacco Use  . Smoking status: Never Smoker  . Smokeless tobacco: Never Used  Substance and Sexual Activity  . Alcohol use: Yes    Comment: rarely  . Drug use: Never  . Sexual activity: Yes    Birth control/protection: I.U.D.  Lifestyle  . Physical activity    Days per week: Not on file    Minutes per session: Not on file  . Stress: Not on file  Relationships  . Social Musicianconnections    Talks on phone: Not on file    Gets together: Not on file    Attends religious service: Not on file    Active member of club or organization: Not on file    Attends meetings of clubs or organizations: Not on file    Relationship status: Not  on file  . Intimate partner violence    Fear of current or ex partner: Not on file    Emotionally abused: Not on file    Physically abused: Not on file    Forced sexual activity: Not on file  Other Topics Concern  . Not on file  Social History Narrative   Patient is right-handed. She lives in a one level home. She drinks tea occasionally. She does not exercise.    Observations/Objective:   Height 5\' 7"  (1.702 m), weight 200 lb (90.7 kg). No acute distress.  Alert and oriented.  Speech fluent and not dysarthric.  Language intact.  Eyes orthophoric on primary gaze.  Face symmetric.  Assessment and Plan:   Migraine with aura, without status migrainosus, not intractable.  1.  For preventative management, continue Trokendi XR 100mg  daily for now.  If needed, we can switch back to topiramate IR or change to nortriptyline/amitriptyline or zonisamide 2.  For abortive therapy,  refill for now. 3.  Limit use of pain relievers to no more than 2 days out of week to prevent risk of rebound or medication-overuse headache. 4.  Keep headache diary 5.  Exercise, hydration, caffeine cessation, sleep hygiene, monitor for and avoid triggers 6.  Consider:  magnesium citrate 400mg  daily, riboflavin 400mg  daily, and coenzyme Q10 100mg  three times daily 7. aggravating factor for migraine. 8. Follow up 4 months.   Follow Up Instructions:    -I discussed the assessment and treatment plan with the patient. The patient was provided an opportunity to ask questions and all were answered. The patient agreed with the plan and demonstrated an understanding of the instructions.   The patient was advised to call back or seek an in-person evaluation if the symptoms worsen or if the condition fails to improve as anticipated.    , DO

## 2019-03-02 ENCOUNTER — Encounter: Payer: Self-pay | Admitting: Neurology

## 2019-03-02 ENCOUNTER — Telehealth (INDEPENDENT_AMBULATORY_CARE_PROVIDER_SITE_OTHER): Payer: No Typology Code available for payment source | Admitting: Neurology

## 2019-03-02 ENCOUNTER — Other Ambulatory Visit: Payer: Self-pay

## 2019-03-02 VITALS — Ht 67.0 in | Wt 200.0 lb

## 2019-03-02 DIAGNOSIS — G43109 Migraine with aura, not intractable, without status migrainosus: Secondary | ICD-10-CM

## 2019-03-02 MED ORDER — UBRELVY 100 MG PO TABS: 1.0000 | ORAL_TABLET | ORAL | 0 refills | Status: DC | PRN

## 2019-03-03 ENCOUNTER — Telehealth: Payer: Self-pay | Admitting: Neurology

## 2019-03-03 MED ORDER — UBRELVY 100 MG PO TABS: 1.0000 | ORAL_TABLET | ORAL | 0 refills | Status: DC | PRN

## 2019-03-03 MED FILL — UBRELVY 100 MG TABS: 100 | 30 days supply | Qty: 16 | Fill #0

## 2019-03-03 NOTE — Progress Notes (Signed)
Wait for Determination Please wait for MedImpact to return a determination.

## 2019-03-03 NOTE — Telephone Encounter (Signed)
rx resent to [place]

## 2019-03-03 NOTE — Telephone Encounter (Signed)
Patient called in needing to have her [employer]. She has had it filled before. [facility] [address] in [place]. Please Call. Thank you

## 2019-03-03 NOTE — Telephone Encounter (Signed)
Edilia Bo (Key: [id number])  Your information has been sent to [place] for Determination Please wait for MedImpact to return a determination.

## 2019-03-03 NOTE — Telephone Encounter (Signed)
Patient called in needing her Shannon Anderson medication sent to [facility] due to her Insurance. Thank you

## 2019-03-03 NOTE — Progress Notes (Signed)
Received fax from [facility] regarding determination.  PA APPROVED PA REF#[id number]  This authorization is effective for a max of 6 fills from 03/02/19 to 08/29/2019 as long as patient is a member of current health plan.  This request is approved for 16 tablets per 30 days.   [phone]  Plan code: [id number]

## 2019-03-03 NOTE — Telephone Encounter (Signed)
Submitted PA renewal via covermymeds

## 2019-03-11 ENCOUNTER — Telehealth: Payer: No Typology Code available for payment source | Admitting: Neurology

## 2019-03-23 ENCOUNTER — Other Ambulatory Visit: Payer: Self-pay | Admitting: Neurology

## 2019-03-23 MED ORDER — ZONISAMIDE 100 MG PO CAPS: 100.0000 mg | ORAL_CAPSULE | Freq: Every day | ORAL | 5 refills | Status: DC

## 2019-03-30 MED FILL — UBRELVY 100 MG TABS: 100 | 30 days supply | Qty: 16 | Fill #1

## 2019-04-20 DIAGNOSIS — Z20828 Contact with and (suspected) exposure to other viral communicable diseases: Secondary | ICD-10-CM | POA: Diagnosis not present

## 2019-05-01 ENCOUNTER — Other Ambulatory Visit: Payer: Self-pay | Admitting: Neurology

## 2019-05-01 MED ORDER — ZONISAMIDE 100 MG PO CAPS: 200.0000 mg | ORAL_CAPSULE | Freq: Every day | ORAL | 5 refills | Status: AC

## 2019-05-01 NOTE — Telephone Encounter (Signed)
I need her to verify which pharmacy.

## 2019-07-15 ENCOUNTER — Telehealth: Payer: No Typology Code available for payment source | Admitting: Neurology

## 2019-08-12 ENCOUNTER — Other Ambulatory Visit: Payer: Self-pay | Admitting: Family Medicine

## 2019-08-12 MED ORDER — ZOLPIDEM TARTRATE 10 MG PO TABS: 10.0000 mg | ORAL_TABLET | Freq: Every evening | ORAL | 1 refills | Status: DC | PRN

## 2019-08-12 NOTE — Telephone Encounter (Signed)
Prescription filled.

## 2019-08-12 NOTE — Telephone Encounter (Signed)
zolpidem (AMBIEN) 10 MG tablet 

## 2019-08-12 NOTE — Telephone Encounter (Signed)
MEDICATION:  [facility][id number] - [place], [place] - [address] AT [facility] OF [address] Phone:  [phone]  Fax:  [phone]       PHARMACY:  [facility] #[id number] - [address], [place] - [address] AT [facility] OF [address] Phone:  [phone]  Fax:  [phone]       Comments:   **Let patient know to contact pharmacy at the end of the day to make sure medication is ready. **  ** Please notify patient to allow 48-72 hours to process**  **Encourage patient to contact the pharmacy for refills or they can request refills through [facility]**

## 2019-08-12 NOTE — Telephone Encounter (Signed)
What medication did pt need?

## 2019-08-12 NOTE — Telephone Encounter (Signed)
Last OV 10/10/18 Zolpidem last filled 02/19/19 #30 with 1

## 2019-08-13 MED ORDER — ZOLPIDEM TARTRATE 10 MG PO TABS: 10.0000 mg | ORAL_TABLET | Freq: Every evening | ORAL | 0 refills | Status: DC | PRN

## 2019-08-13 NOTE — Telephone Encounter (Signed)
Please advise per front desk pt is working there.

## 2019-08-13 NOTE — Telephone Encounter (Signed)
Zolpidem was sent to the wrong pharmacy please send to  [facility] #[id number] - [place], [place] - [address] AT [facility] OF [address]

## 2019-08-13 NOTE — Addendum Note (Signed)
Addended by: Yvone Neu L on: 08/13/2019 11:40 AM   Modules accepted: Orders

## 2019-08-20 ENCOUNTER — Encounter: Payer: Self-pay | Admitting: *Deleted

## 2019-08-20 NOTE — Progress Notes (Signed)
Shannon Anderson (Key: [id number]) Shannon Anderson 100MG  tablets   Form MedImpact Medication Request Form  Plan Contact [phone] phone [phone] fax Created 3 days ago Sent to Plan less than a minute ago Determination Wait for Determination Please wait for MedImpact 2017 to return a determination.

## 2019-09-04 ENCOUNTER — Encounter: Payer: Self-pay | Admitting: Neurology

## 2019-09-04 ENCOUNTER — Telehealth: Payer: Self-pay | Admitting: Neurology

## 2019-09-04 ENCOUNTER — Other Ambulatory Visit: Payer: Self-pay

## 2019-09-04 MED ORDER — UBRELVY 100 MG PO TABS: 1.0000 | ORAL_TABLET | ORAL | 1 refills | Status: AC | PRN

## 2019-09-04 NOTE — Telephone Encounter (Signed)
LMOVM  pt last 02/2019 with a f/u in four months due pt canceled her appt in 07/2019.  per DR. Jaffe pt can have refills but pt needs to sschedule a visit to get further refills

## 2019-09-04 NOTE — Telephone Encounter (Signed)
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 It's [address]. The phone number is [phone]. Thanks!

## 2019-09-04 NOTE — Progress Notes (Addendum)
Shannon Anderson (Key: [id number]) [name] 100MG  tablets   Form [employer] Electronic PA Form [phone] NCPDP) Created 10 minutes ago Sent to Plan 7 minutes ago Plan Response 6 minutes ago Submit Clinical Questions 1 minute ago Determination Wait for Determination Please wait for [facility] [employer] 2017 to return a determination.  Patient now has new insurance- Tricare ID #: [id number] and wanted medication sent to new pharm in [place]. New PA created.   DENIAL- on the ubrelvy medication through new ins. They want patient to try Nurtec OTD for 2 month trail before they will approve. Called patient and LMOM to see what she would like to do. Samples of Nurtec available if she would like to try.

## 2019-10-24 IMAGING — US US EXTREM LOW VENOUS*R*
1 series · 14 of 24 positions shown · non-contrast
Comparison: None.

CLINICAL DATA: Right lower extremity pain and swelling

EXAM:
RIGHT LOWER EXTREMITY VENOUS DUPLEX ULTRASOUND
TECHNIQUE: Doppler venous assessment of the right lower extremity deep venous
system was performed, including characterization of spectral flow,
compressibility, and phasicity.

[Series 1: us extrem low venous*right* · 0.08mm/px · 14 of 33 slices shown]
[im 1/33]
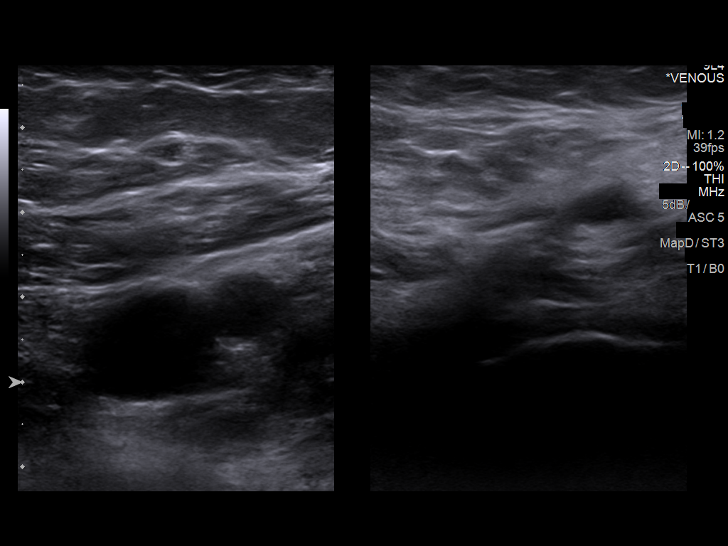
[im 3/33]
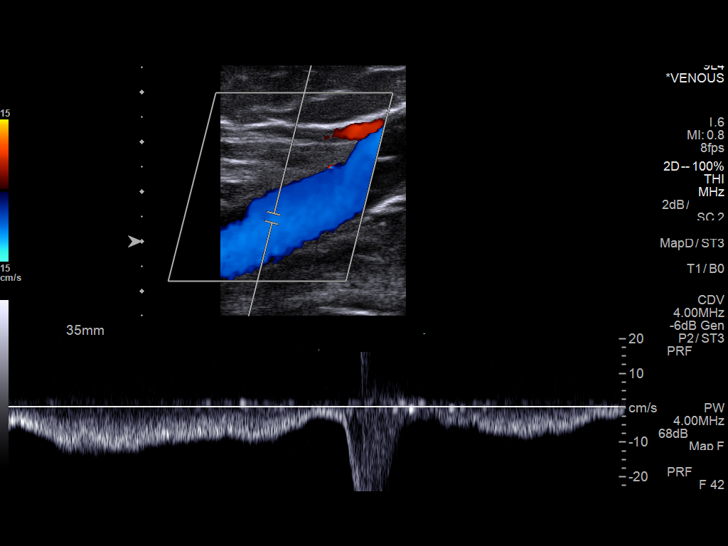
[im 6/33]
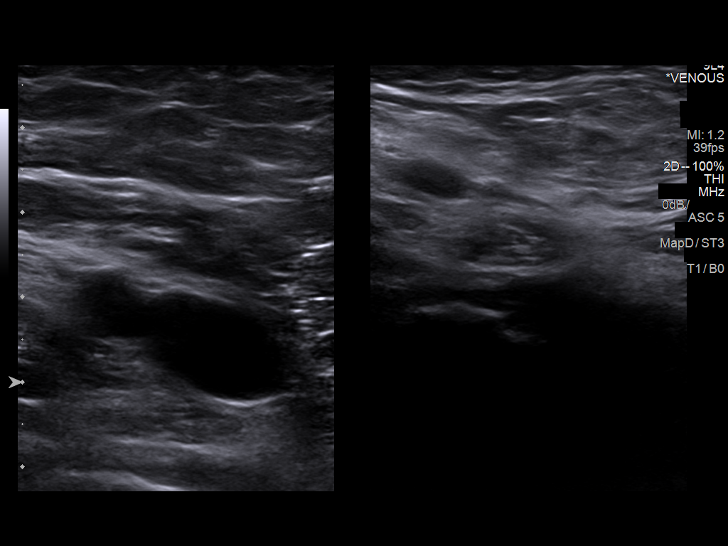
[im 9/33]
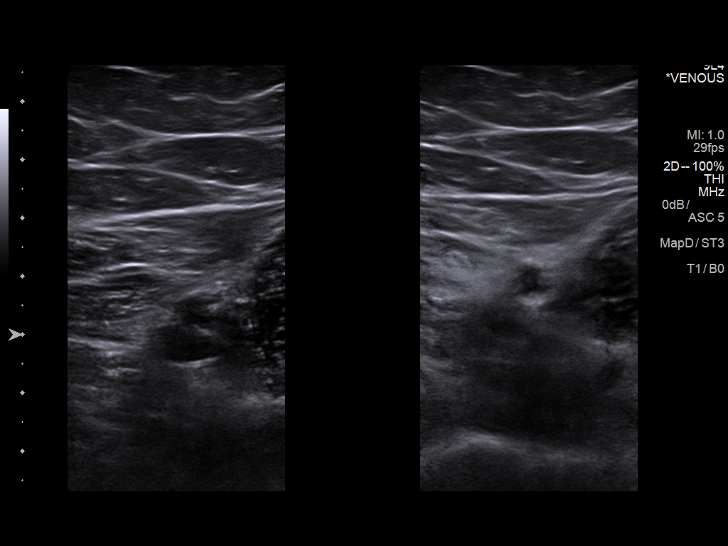
[im 10/33]
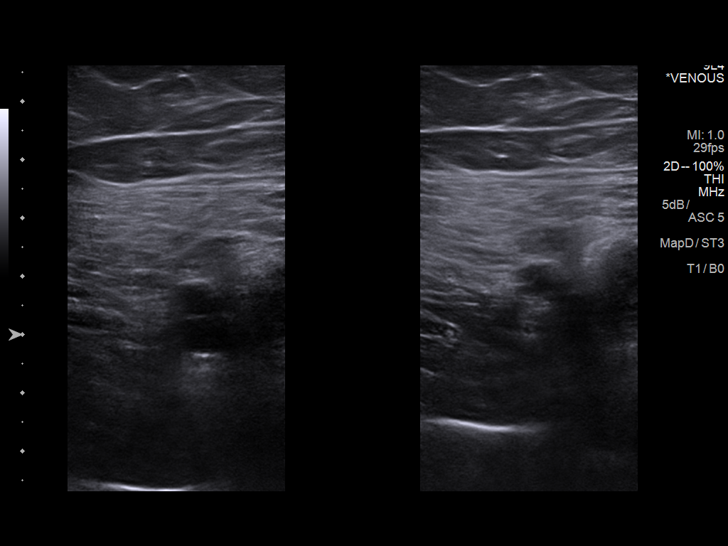
[im 13/33]
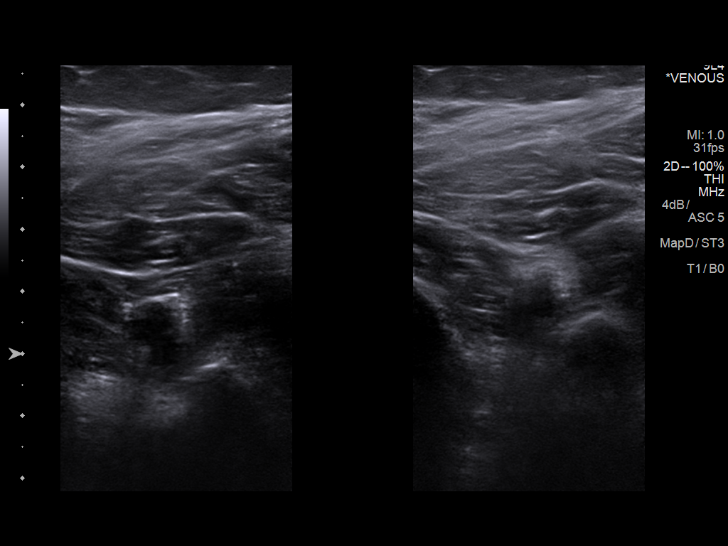
[im 16/33]
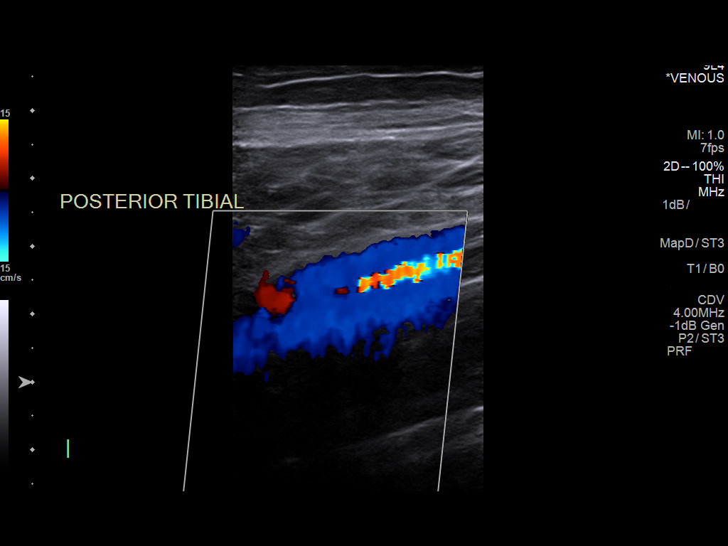
[im 17/33]
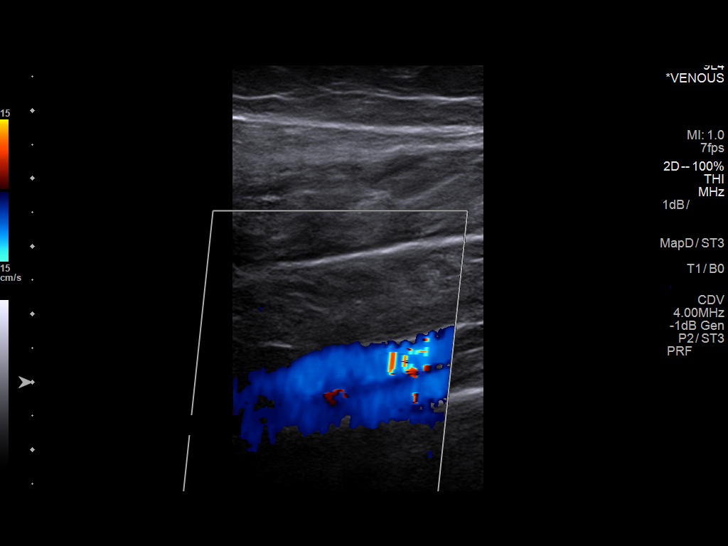
[im 20/33]
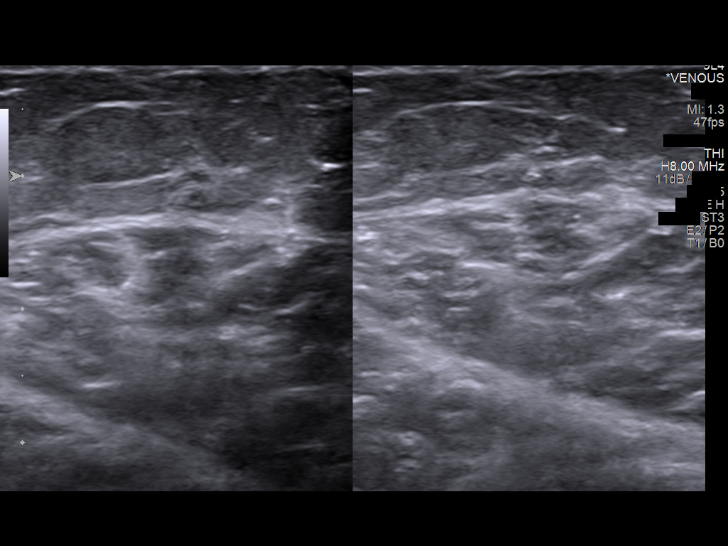
[im 23/33]
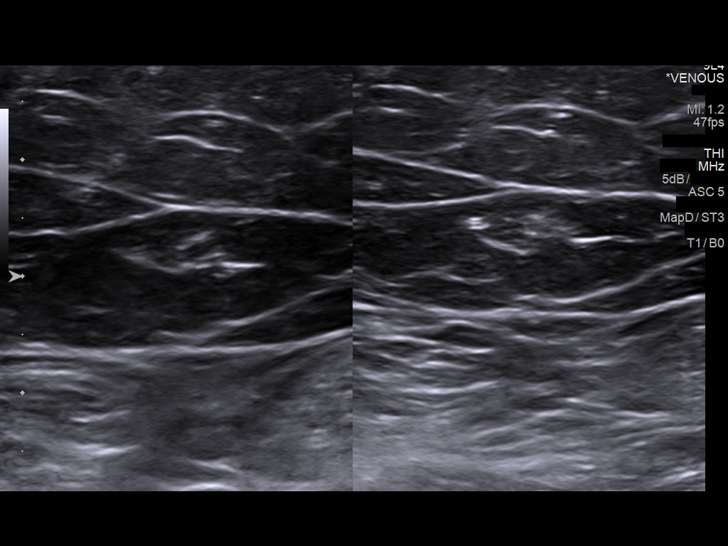
[im 26/33]
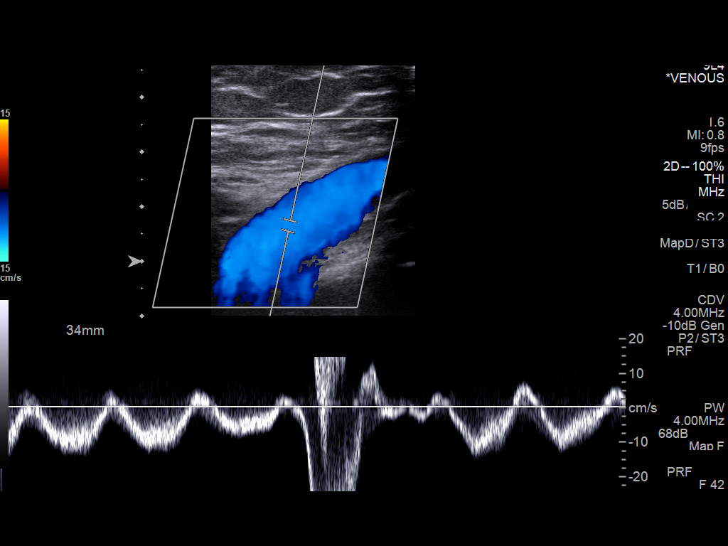
[im 27/33]
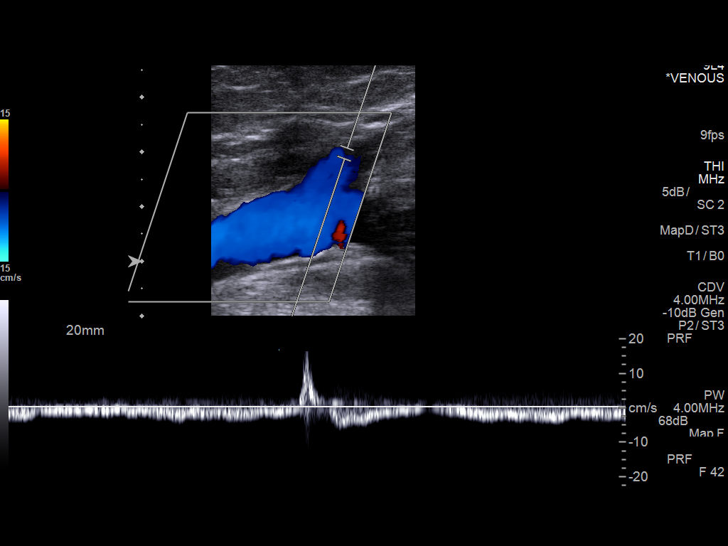
[im 30/33]
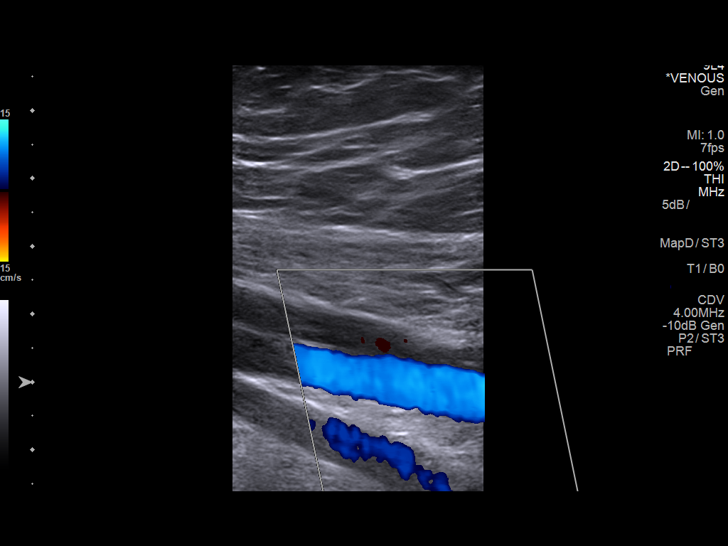
[im 33/33]
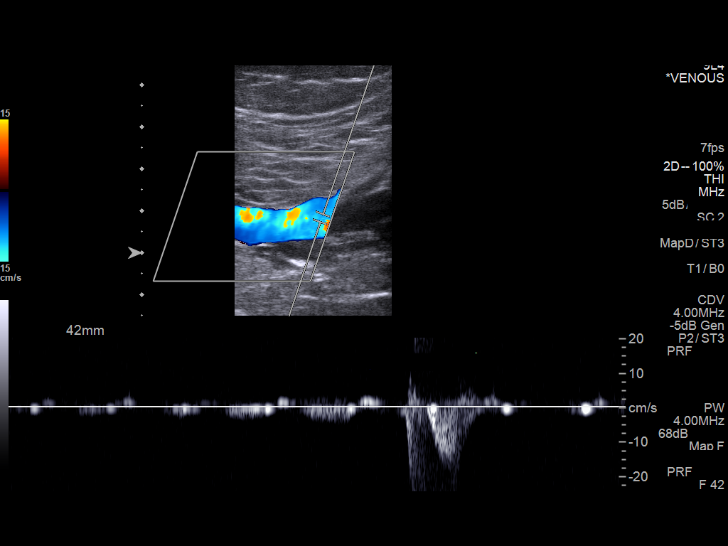

[14 of 24 positions shown; findings below may reference images not displayed]

FINDINGS: There is complete compressibility of the common femoral, femoral,
and popliteal veins. Doppler analysis demonstrates respiratory
phasicity and augmentation of flow with calf compression. No obvious
superficial vein or calf vein thrombosis.
IMPRESSION: No evidence of right lower extremity DVT.

## 2019-11-13 ENCOUNTER — Other Ambulatory Visit: Payer: Self-pay | Admitting: Family Medicine

## 2019-11-16 NOTE — Telephone Encounter (Signed)
Last OV 10/10/18 Zolpidem last filled 08/13/19 #30 with 0

## 2019-11-17 ENCOUNTER — Encounter: Payer: Self-pay | Admitting: General Practice

## 2019-11-17 MED ORDER — ZOLPIDEM TARTRATE 10 MG PO TABS: ORAL_TABLET | ORAL | 1 refills | Status: DC

## 2019-11-17 MED ORDER — ZOLPIDEM TARTRATE 10 MG PO TABS: ORAL_TABLET | ORAL | 1 refills | Status: AC

## 2019-11-17 NOTE — Telephone Encounter (Signed)
Patient called back and could not get her rx transferred from [facility].  She needs it called into [facility] in [place] - phone #  [phone]

## 2019-11-17 NOTE — Addendum Note (Signed)
Addended by: Sheliah Hatch on: 11/17/2019 02:07 PM   Modules accepted: Orders

## 2020-01-18 ENCOUNTER — Telehealth (HOSPITAL_BASED_OUTPATIENT_CLINIC_OR_DEPARTMENT_OTHER): Payer: Self-pay

## 2020-01-18 ENCOUNTER — Ambulatory Visit: Admitting: Surgical Oncology

## 2020-01-18 DIAGNOSIS — D0501 Lobular carcinoma in situ of right breast: Secondary | ICD-10-CM

## 2020-01-18 NOTE — Telephone Encounter (Signed)
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 Requests call back on 10/19.

## 2020-01-19 NOTE — Telephone Encounter (Signed)
LMTCB 10/19

## 2020-01-20 LAB — EXTERNAL PATHOLOGY REVIEW

## 2020-01-25 ENCOUNTER — Ambulatory Visit (HOSPITAL_BASED_OUTPATIENT_CLINIC_OR_DEPARTMENT_OTHER): Payer: Self-pay | Admitting: Surgical Oncology

## 2020-01-25 ENCOUNTER — Ambulatory Visit (HOSPITAL_BASED_OUTPATIENT_CLINIC_OR_DEPARTMENT_OTHER): Payer: Self-pay

## 2020-01-25 ENCOUNTER — Encounter (HOSPITAL_BASED_OUTPATIENT_CLINIC_OR_DEPARTMENT_OTHER): Payer: Self-pay

## 2020-02-02 ENCOUNTER — Telehealth (HOSPITAL_BASED_OUTPATIENT_CLINIC_OR_DEPARTMENT_OTHER): Payer: Self-pay

## 2020-02-02 NOTE — Telephone Encounter (Signed)
Patient canceled initial appt with Dr. Vela Prose on 10/25 for DCIS. RN left voicemail on 10/18 to connect prior to first appt.     Removing from tracking as patient canceled her visit.

## 2020-09-28 ENCOUNTER — Encounter: Payer: Self-pay | Admitting: *Deleted
